# Patient Record
Sex: Male | Born: 1960 | Race: White | Hispanic: No | Marital: Married | Smoking: Never smoker
Health system: Southern US, Community
[De-identification: ages and names within clinical notes are randomized; demographics above are authoritative.]

## PROBLEM LIST (undated history)

## (undated) DIAGNOSIS — F32A Depression, unspecified: Secondary | ICD-10-CM

## (undated) DIAGNOSIS — J329 Chronic sinusitis, unspecified: Secondary | ICD-10-CM

## (undated) DIAGNOSIS — K219 Gastro-esophageal reflux disease without esophagitis: Secondary | ICD-10-CM

## (undated) DIAGNOSIS — T7840XA Allergy, unspecified, initial encounter: Secondary | ICD-10-CM

## (undated) DIAGNOSIS — F419 Anxiety disorder, unspecified: Secondary | ICD-10-CM

## (undated) DIAGNOSIS — F329 Major depressive disorder, single episode, unspecified: Secondary | ICD-10-CM

## (undated) HISTORY — PX: COLONOSCOPY: SHX174

## (undated) HISTORY — DX: Major depressive disorder, single episode, unspecified: F32.9

## (undated) HISTORY — DX: Allergy, unspecified, initial encounter: T78.40XA

## (undated) HISTORY — DX: Chronic sinusitis, unspecified: J32.9

## (undated) HISTORY — DX: Anxiety disorder, unspecified: F41.9

## (undated) HISTORY — DX: Gastro-esophageal reflux disease without esophagitis: K21.9

## (undated) HISTORY — DX: Depression, unspecified: F32.A

---

## 1998-12-04 ENCOUNTER — Ambulatory Visit (HOSPITAL_COMMUNITY): Admission: RE | Admit: 1998-12-04 | Discharge: 1998-12-04 | Payer: Self-pay | Admitting: Gastroenterology

## 1998-12-04 ENCOUNTER — Encounter: Payer: Self-pay | Admitting: Gastroenterology

## 1999-11-24 HISTORY — PX: OTHER SURGICAL HISTORY: SHX169

## 2005-07-15 ENCOUNTER — Ambulatory Visit: Payer: Self-pay | Admitting: Internal Medicine

## 2005-09-24 ENCOUNTER — Ambulatory Visit: Payer: Self-pay | Admitting: Internal Medicine

## 2005-10-29 ENCOUNTER — Ambulatory Visit: Payer: Self-pay | Admitting: Internal Medicine

## 2006-01-20 ENCOUNTER — Ambulatory Visit: Payer: Self-pay | Admitting: Internal Medicine

## 2006-02-04 ENCOUNTER — Ambulatory Visit: Payer: Self-pay | Admitting: Internal Medicine

## 2006-04-27 ENCOUNTER — Ambulatory Visit: Payer: Self-pay | Admitting: Internal Medicine

## 2007-03-25 ENCOUNTER — Ambulatory Visit: Payer: Self-pay | Admitting: Internal Medicine

## 2007-09-26 ENCOUNTER — Telehealth: Payer: Self-pay | Admitting: Internal Medicine

## 2007-09-29 ENCOUNTER — Telehealth: Payer: Self-pay | Admitting: Internal Medicine

## 2007-10-03 ENCOUNTER — Telehealth: Payer: Self-pay | Admitting: *Deleted

## 2007-10-07 ENCOUNTER — Ambulatory Visit: Payer: Self-pay | Admitting: Internal Medicine

## 2007-10-07 DIAGNOSIS — F32A Depression, unspecified: Secondary | ICD-10-CM | POA: Insufficient documentation

## 2007-10-07 DIAGNOSIS — F411 Generalized anxiety disorder: Secondary | ICD-10-CM | POA: Insufficient documentation

## 2007-10-07 DIAGNOSIS — J328 Other chronic sinusitis: Secondary | ICD-10-CM

## 2007-10-07 DIAGNOSIS — K219 Gastro-esophageal reflux disease without esophagitis: Secondary | ICD-10-CM | POA: Insufficient documentation

## 2007-10-07 DIAGNOSIS — F329 Major depressive disorder, single episode, unspecified: Secondary | ICD-10-CM

## 2007-10-18 ENCOUNTER — Encounter: Payer: Self-pay | Admitting: Internal Medicine

## 2007-10-19 ENCOUNTER — Encounter: Admission: RE | Admit: 2007-10-19 | Discharge: 2007-10-19 | Payer: Self-pay | Admitting: Ophthalmology

## 2008-01-12 ENCOUNTER — Ambulatory Visit: Payer: Self-pay | Admitting: Internal Medicine

## 2008-02-11 ENCOUNTER — Telehealth: Payer: Self-pay | Admitting: Internal Medicine

## 2008-02-11 ENCOUNTER — Ambulatory Visit: Payer: Self-pay | Admitting: Internal Medicine

## 2008-02-11 DIAGNOSIS — J039 Acute tonsillitis, unspecified: Secondary | ICD-10-CM

## 2008-02-11 LAB — CONVERTED CEMR LAB: Rapid Strep: NEGATIVE

## 2008-04-06 ENCOUNTER — Ambulatory Visit: Payer: Self-pay | Admitting: Internal Medicine

## 2008-04-12 ENCOUNTER — Ambulatory Visit: Payer: Self-pay | Admitting: Internal Medicine

## 2008-05-17 ENCOUNTER — Ambulatory Visit: Payer: Self-pay | Admitting: Internal Medicine

## 2008-08-17 ENCOUNTER — Ambulatory Visit: Payer: Self-pay | Admitting: Internal Medicine

## 2009-01-31 ENCOUNTER — Ambulatory Visit: Payer: Self-pay | Admitting: Internal Medicine

## 2009-04-23 ENCOUNTER — Telehealth: Payer: Self-pay | Admitting: Internal Medicine

## 2009-04-23 ENCOUNTER — Ambulatory Visit: Payer: Self-pay | Admitting: Internal Medicine

## 2009-04-23 DIAGNOSIS — J019 Acute sinusitis, unspecified: Secondary | ICD-10-CM

## 2009-05-20 ENCOUNTER — Encounter: Payer: Self-pay | Admitting: Internal Medicine

## 2009-08-19 ENCOUNTER — Telehealth (INDEPENDENT_AMBULATORY_CARE_PROVIDER_SITE_OTHER): Payer: Self-pay | Admitting: *Deleted

## 2009-08-19 ENCOUNTER — Telehealth: Payer: Self-pay | Admitting: *Deleted

## 2009-08-28 ENCOUNTER — Ambulatory Visit: Payer: Self-pay | Admitting: Internal Medicine

## 2009-08-28 DIAGNOSIS — L255 Unspecified contact dermatitis due to plants, except food: Secondary | ICD-10-CM

## 2009-12-20 ENCOUNTER — Ambulatory Visit: Payer: Self-pay | Admitting: Internal Medicine

## 2010-01-06 ENCOUNTER — Telehealth: Payer: Self-pay | Admitting: Internal Medicine

## 2010-01-07 ENCOUNTER — Telehealth: Payer: Self-pay | Admitting: Internal Medicine

## 2010-05-01 ENCOUNTER — Telehealth: Payer: Self-pay | Admitting: Internal Medicine

## 2010-05-14 ENCOUNTER — Telehealth: Payer: Self-pay | Admitting: Internal Medicine

## 2010-05-16 ENCOUNTER — Ambulatory Visit: Payer: Self-pay | Admitting: Family Medicine

## 2010-06-02 ENCOUNTER — Ambulatory Visit: Payer: Self-pay | Admitting: Internal Medicine

## 2010-06-02 DIAGNOSIS — E291 Testicular hypofunction: Secondary | ICD-10-CM | POA: Insufficient documentation

## 2010-06-04 LAB — CONVERTED CEMR LAB: Testosterone: 564.21 ng/dL (ref 350.00–890.00)

## 2010-06-23 ENCOUNTER — Ambulatory Visit: Payer: Self-pay | Admitting: Internal Medicine

## 2010-06-24 ENCOUNTER — Ambulatory Visit: Payer: Self-pay | Admitting: Internal Medicine

## 2010-06-24 ENCOUNTER — Telehealth: Payer: Self-pay | Admitting: Internal Medicine

## 2010-06-24 LAB — CONVERTED CEMR LAB
ALT: 30 units/L (ref 0–53)
AST: 29 units/L (ref 0–37)
Albumin: 4 g/dL (ref 3.5–5.2)
Alkaline Phosphatase: 57 units/L (ref 39–117)
BUN: 12 mg/dL (ref 6–23)
Basophils Absolute: 0.1 10*3/uL (ref 0.0–0.1)
Basophils Relative: 0.8 % (ref 0.0–3.0)
Bilirubin Urine: NEGATIVE
Bilirubin, Direct: 0.1 mg/dL (ref 0.0–0.3)
Blood in Urine, dipstick: NEGATIVE
CO2: 32 meq/L (ref 19–32)
Calcium: 9.4 mg/dL (ref 8.4–10.5)
Chlamydia, Swab/Urine, PCR: NEGATIVE
Chloride: 110 meq/L (ref 96–112)
Cholesterol: 174 mg/dL (ref 0–200)
Creatinine, Ser: 0.9 mg/dL (ref 0.4–1.5)
Eosinophils Absolute: 0.3 10*3/uL (ref 0.0–0.7)
Eosinophils Relative: 4.3 % (ref 0.0–5.0)
GC Probe Amp, Urine: NEGATIVE
GFR calc non Af Amer: 90.63 mL/min (ref >60)
Glucose, Bld: 97 mg/dL (ref 70–99)
Glucose, Urine, Semiquant: NEGATIVE
HCT: 44.7 % (ref 39.0–52.0)
HDL: 32.4 mg/dL — ABNORMAL LOW (ref >39.00)
Hemoglobin: 15.2 g/dL (ref 13.0–17.0)
Ketones, urine, test strip: NEGATIVE
LDL Cholesterol: 123 mg/dL — ABNORMAL HIGH (ref 0–99)
Lymphocytes Relative: 33.6 % (ref 12.0–46.0)
Lymphs Abs: 2.1 10*3/uL (ref 0.7–4.0)
MCHC: 33.9 g/dL (ref 30.0–36.0)
MCV: 94.7 fL (ref 78.0–100.0)
Monocytes Absolute: 0.6 10*3/uL (ref 0.1–1.0)
Monocytes Relative: 9.3 % (ref 3.0–12.0)
Neutro Abs: 3.2 10*3/uL (ref 1.4–7.7)
Neutrophils Relative %: 52 % (ref 43.0–77.0)
Nitrite: NEGATIVE
Platelets: 311 10*3/uL (ref 150.0–400.0)
Potassium: 4.1 meq/L (ref 3.5–5.1)
Protein, U semiquant: NEGATIVE
RBC: 4.73 M/uL (ref 4.22–5.81)
RDW: 13.1 % (ref 11.5–14.6)
Sodium: 147 meq/L — ABNORMAL HIGH (ref 135–145)
Specific Gravity, Urine: 1.02
TSH: 1.06 microintl units/mL (ref 0.35–5.50)
Total Bilirubin: 0.6 mg/dL (ref 0.3–1.2)
Total CHOL/HDL Ratio: 5
Total Protein: 6.9 g/dL (ref 6.0–8.3)
Triglycerides: 93 mg/dL (ref 0.0–149.0)
Urobilinogen, UA: 0.2
VLDL: 18.6 mg/dL (ref 0.0–40.0)
WBC Urine, dipstick: NEGATIVE
WBC: 6.2 10*3/uL (ref 4.5–10.5)
pH: 6.5

## 2010-06-30 ENCOUNTER — Ambulatory Visit: Payer: Self-pay | Admitting: Internal Medicine

## 2010-10-22 ENCOUNTER — Ambulatory Visit: Payer: Self-pay | Admitting: Internal Medicine

## 2010-10-22 DIAGNOSIS — J309 Allergic rhinitis, unspecified: Secondary | ICD-10-CM | POA: Insufficient documentation

## 2010-10-22 DIAGNOSIS — J310 Chronic rhinitis: Secondary | ICD-10-CM

## 2010-12-23 NOTE — Progress Notes (Signed)
Summary: sinus infection  Phone Note Call from Patient   Caller: Patient Call For: Stacie Glaze MD Summary of Call: Pt would like RX for sinus infection.....green mucus from nose, stuffiness, headache, and not sleeping due to congestion. No fever. [facility]) [phone] Initial call taken by: Lynann Beaver CMA,  May 01, 2010 11:18 AM  Follow-up for Phone Call        biaxin 500 two times a day for 1o days and use of allegrad two times a day witch is not OTC Follow-up by: Stacie Glaze MD,  May 01, 2010 1:15 PM    New/Updated Medications: BIAXIN 500 MG TABS (CLARITHROMYCIN) one by mouth two times a day x 10 days ALLEGRA-D 12 HOUR 60-120 MG XR12H-TAB (FEXOFENADINE-PSEUDOEPHEDRINE) one by mouth bid Prescriptions: ALLEGRA-D 12 HOUR 60-120 MG XR12H-TAB (FEXOFENADINE-PSEUDOEPHEDRINE) one by mouth bid  #20 x 0   Entered by:   Lynann Beaver CMA   Authorized by:   Stacie Glaze MD   Signed by:   Lynann Beaver CMA on 05/01/2010   Method used:   Electronically to        [facility] ([facility])       [address]       [place], [place]  [address]       Ph: [phone]       Fax: [phone]   RxID:   [id number] BIAXIN 500 MG TABS (CLARITHROMYCIN) one by mouth two times a day x 10 days  #20 x 0   Entered by:   Lynann Beaver CMA   Authorized by:   Stacie Glaze MD   Signed by:   Lynann Beaver CMA on 05/01/2010   Method used:   Electronically to        [facility] ([facility])       [address]       [place], [place]  [address]       Ph: [phone]       Fax: [phone]   RxID:   [id number]  Pt notified.

## 2010-12-23 NOTE — Progress Notes (Signed)
Summary: Pt req to add Abilify to Celexa. Also still has sinus inf  Phone Note Call from Patient Call back at [facility] Phone [phone]   Caller: Patient Summary of Call: Pt called and says that he has been on generic for Celexa and wants to discuss possibly changing med to Abilify for adding Abilify to the Celexa. Pt also says that he has finished the antibiotic, but still has discolored sinus discharge.     Initial call taken by: Lucy Antigua,  May 14, 2010 10:40 AM  Follow-up for Phone Call        changing medicastion will requeire an ov, as far as sinus problem can come in and see dr blyth Follow-up by: Willy Eddy, LPN,  May 14, 2010 10:44 AM  Additional Follow-up for Phone Call Additional follow up Details #1::        I called and lft vm for pt to call back and sch appt with Dr. Abner Greenspan re: sinus inf and also notified he that he would need ov with PCP for med changes, as noted above. Waiting on call back.   Additional Follow-up by: Lucy Antigua,  May 14, 2010 1:09 PM

## 2010-12-23 NOTE — Assessment & Plan Note (Signed)
Summary: MEDICATION CONCERNS / SINUSITIS // RS   Vital Signs:  Patient profile:   50 year old male Height:      73 inches Weight:      212 pounds BMI:     28.07 Temp:     98.2 degrees F oral Pulse rate:   72 / minute Resp:     14 per minute BP sitting:   130 / 80  (left arm)  Vitals Entered By: Willy Eddy, LPN (June 02, 2010 10:44 AM) CC: roa- c/o ch ronic sinusitis after 2 rounds of antibioitcs with no relief-m c/o fatigue and celexa doesnt appear to be working as well as at first, URI symptoms   Primary Care Provider:  Stacie Glaze MD  CC:  roa- c/o ch ronic sinusitis after 2 rounds of antibioitcs with no relief-m c/o fatigue and celexa doesnt appear to be working as well as at first and URI symptoms.  History of Present Illness:  URI Symptoms      This is a 50 year old man who presents with URI symptoms.  The patient reports nasal congestion, productive cough, and earache, but denies clear nasal discharge, purulent nasal discharge, sore throat, dry cough, and sick contacts.  The patient denies fever, low-grade fever (<100.5 degrees), fever of 100.5-103 degrees, fever of 103.1-104 degrees, fever to >104 degrees, stiff neck, dyspnea, wheezing, rash, vomiting, diarrhea, use of an antipyretic, and response to antipyretic.  The patient also reports headache.    Preventive Screening-Counseling & Management  Alcohol-Tobacco     Smoking Status: never  Problems Prior to Update: 1)  Plant Dermatitis  (ICD-692.6) 2)  Acute Sinusitis, Unspecified  (ICD-461.9) 3)  Contraceptive Management  (ICD-V25.09) 4)  Tonsillitis, Exudative, Acute  (ICD-463) 5)  Other Chronic Sinusitis  (ICD-473.8) 6)  Depression  (ICD-311) 7)  Anxiety  (ICD-300.00) 8)  Gerd  (ICD-530.81)  Current Problems (verified): 1)  Plant Dermatitis  (ICD-692.6) 2)  Acute Sinusitis, Unspecified  (ICD-461.9) 3)  Contraceptive Management  (ICD-V25.09) 4)  Tonsillitis, Exudative, Acute  (ICD-463) 5)  Other  Chronic Sinusitis  (ICD-473.8) 6)  Depression  (ICD-311) 7)  Anxiety  (ICD-300.00) 8)  Gerd  (ICD-530.81)  Medications Prior to Update: 1)  Omeprazole 20 Mg  Tbec (Omeprazole) .... Once Daily 2)  Afrin Allergy 0.5 %  Soln (Phenylephrine Hcl) .... As Directed 3)  Celexa 40 Mg  Tabs (Citalopram Hydrobromide) .... One By Mouth Daily 4)  Mucinex D 60-600 Mg Xr12h-Tab (Pseudoephedrine-Guaifenesin) .... One By Mouth Two Times A Day 5)  Allegra-D 12 Hour 60-120 Mg Xr12h-Tab (Fexofenadine-Pseudoephedrine) .... One By Mouth Bid 6)  Cefdinir 300 Mg Caps (Cefdinir) .... One By Mouth Two Times A Day For 10 Days  Current Medications (verified): 1)  Omeprazole 20 Mg  Tbec (Omeprazole) .... Once Daily 2)  Afrin Allergy 0.5 %  Soln (Phenylephrine Hcl) .... As Directed 3)  Celexa 40 Mg  Tabs (Citalopram Hydrobromide) .... One By Mouth Daily 4)  Mucinex D 60-600 Mg Xr12h-Tab (Pseudoephedrine-Guaifenesin) .... One By Mouth Two Times A Day 5)  Allegra-D 12 Hour 60-120 Mg Xr12h-Tab (Fexofenadine-Pseudoephedrine) .... One By Mouth Bid 6)  Cefdinir 300 Mg Caps (Cefdinir) .... One By Mouth Two Times A Day For 10 Days  Allergies (verified): 1)  ! Pcn  Past History:  Social History: Last updated: 01/12/2008 Retired Single Divorced  Risk Factors: Smoking Status: never (06/02/2010)  Past medical, surgical, family and social histories (including risk factors) reviewed, and no changes noted (except  as noted below).  Past Medical History: Reviewed history from 10/07/2007 and no changes required. GERD Anxiety Depression  Past Surgical History: Reviewed history from 10/07/2007 and no changes required. EGD with dilation  Family History: Reviewed history and no changes required.  Social History: Reviewed history from 01/12/2008 and no changes required. Retired Single Divorced  Review of Systems  The patient denies anorexia, fever, weight loss, weight gain, vision loss, decreased hearing,  hoarseness, chest pain, syncope, dyspnea on exertion, peripheral edema, prolonged cough, headaches, hemoptysis, abdominal pain, melena, hematochezia, severe indigestion/heartburn, hematuria, incontinence, genital sores, muscle weakness, suspicious skin lesions, transient blindness, difficulty walking, depression, unusual weight change, abnormal bleeding, enlarged lymph nodes, angioedema, and breast masses.    Physical Exam  General:  Well-developed,well-nourished,in no acute distress; alert,appropriate and cooperative throughout examination Head:  normocephalic and atraumatic.   Eyes:  vision grossly intact, pupils equal, and pupils round. no redness  Ears:  External ear exam shows no significant lesions or deformities.  Otoscopic examination reveals clear canals, tympanic membranes are intact bilaterally without bulging, retraction, inflammation or discharge. Hearing is grossly normal bilaterally. Mouth:  Oral mucosa and oropharynx without lesions or exudates.  Teeth in good repair. Neck:  No deformities, masses, or tenderness noted. Lungs:  Normal respiratory effort, chest expands symmetrically. Lungs are clear to auscultation, no crackles or wheezes. Heart:  normal rate and regular rhythm.   Abdomen:  soft and non-tender.     Impression & Recommendations:  Problem # 1:  OTHER CHRONIC SINUSITIS (ICD-473.8) the pt has some persistantce of the sinus pain and has persistant pressure The following medications were removed from the medication list:    Cefdinir 300 Mg Caps (Cefdinir) ..... One by mouth two times a day for 10 days His updated medication list for this problem includes:    Afrin Allergy 0.5 % Soln (Phenylephrine hcl) .Marland Kitchen... As directed    Mucinex D 60-600 Mg Xr12h-tab (Pseudoephedrine-guaifenesin) ..... One by mouth two times a day    Allegra-d 12 Hour 60-120 Mg Xr12h-tab (Fexofenadine-pseudoephedrine) ..... One by mouth bid    Doxycycline Hyclate 100 Mg Caps (Doxycycline hyclate)  ..... One by mouth two times a day for 21 days  Take antibiotics for full duration. Discussed treatment options including indications for coronal CT scan of sinuses and ENT referral.   Problem # 2:  DEPRESSION (ICD-311) had a tough month in june with isolation and low energy has been doing better over the last few week still notes decline in energy His updated medication list for this problem includes:    Citalopram Hydrobromide 40 Mg Tabs (Citalopram hydrobromide) ..... One by mouth daily  Discussed treatment options, including trial of antidpressant medication. Will refer to behavioral health. Follow-up call in in 24-48 hours and recheck in 2 weeks, sooner as needed. Patient agrees to call if any worsening of symptoms or thoughts of doing harm arise. Verified that the patient has no suicidal ideation at this time.   Problem # 3:  HYPOGONADISM (ICD-257.2)  Orders: Venipuncture (14782) TLB-Testosterone, Total (84403-TESTO)  Complete Medication List: 1)  Omeprazole 20 Mg Tbec (Omeprazole) .... Once daily 2)  Afrin Allergy 0.5 % Soln (Phenylephrine hcl) .... As directed 3)  Citalopram Hydrobromide 40 Mg Tabs (Citalopram hydrobromide) .... One by mouth daily 4)  Mucinex D 60-600 Mg Xr12h-tab (Pseudoephedrine-guaifenesin) .... One by mouth two times a day 5)  Allegra-d 12 Hour 60-120 Mg Xr12h-tab (Fexofenadine-pseudoephedrine) .... One by mouth bid 6)  Doxycycline Hyclate 100 Mg Caps (Doxycycline hyclate) .[facility]  One by mouth two times a day for 21 days  Patient Instructions: 1)  omnaris two times a day 2)   for 2 weeks and then once a day 3)  try to stop the afrin Prescriptions: CITALOPRAM HYDROBROMIDE 40 MG TABS (CITALOPRAM HYDROBROMIDE) one by mouth daily  #90 x 3   Entered and Authorized by:   Stacie Glaze MD   Signed by:   Stacie Glaze MD on 06/02/2010   Method used:   Electronically to        [facility] # [id number][facility] (retail)       [address]        [place], [place]  [address]       Ph: [phone]       Fax: [phone]   RxID:   [id number] DOXYCYCLINE HYCLATE 100 MG CAPS (DOXYCYCLINE HYCLATE) one by mouth two times a day for 21 days  #42 x 0   Entered and Authorized by:   Stacie Glaze MD   Signed by:   Stacie Glaze MD on 06/02/2010   Method used:   Electronically to        [facility] # [id number]* (retail)       [address]       [place], [place]  [address]       Ph: [phone]       Fax: [phone]   RxID:   [id number]

## 2010-12-23 NOTE — Progress Notes (Signed)
  Phone Note Call from Patient   Reason for Call: Lab or Test Results Summary of Call: pt called stating levaquin cost too muxch-wants something cheapier-[facility] Initial call taken by: Willy Eddy, LPN,  January 07, 2010 2:28 PM  Follow-up for Phone Call        per dr Lovell Sheehan- may have clarithromycin xl 500 1 two times a day for 10 days Follow-up by: Willy Eddy, LPN,  January 07, 2010 2:39 PM    New/Updated Medications: CLARITHROMYCIN 500 MG TABS (CLARITHROMYCIN) 1 two times a day Prescriptions: CLARITHROMYCIN 500 MG TABS (CLARITHROMYCIN) 1 two times a day  #20 x 0   Entered by:   Raechel Ache, RN   Authorized by:   Stacie Glaze MD   Signed by:   Raechel Ache, RN on 01/07/2010   Method used:   Electronically to        [facility] # [id number]* (retail)       [address]       [place], [place]  [address]       Ph: [phone]       Fax: [phone]   RxID:   [id number]

## 2010-12-23 NOTE — Progress Notes (Signed)
Summary: refills  Phone Note Refill Request Call back at Home Phone [phone] Message from:  Patient---live call  Refills Requested: Medication #1:  CITALOPRAM HYDROBROMIDE 40 MG TABS one by mouth daily [facility]. pt is out of meds.  Initial call taken by: Warnell Forester,  June 24, 2010 8:25 AM    Prescriptions: CITALOPRAM HYDROBROMIDE 40 MG TABS (CITALOPRAM HYDROBROMIDE) one by mouth daily  #90 x 0   Entered by:   Lynann Beaver CMA   Authorized by:   Stacie Glaze MD   Signed by:   Lynann Beaver CMA on 06/24/2010   Method used:   Electronically to        [facility] # [id number]* (retail)       [address]       [place], [place]  [address]       Ph: [phone]       Fax: [phone]   RxID:   [id number]

## 2010-12-23 NOTE — Assessment & Plan Note (Signed)
Summary: SINUSITIS // RS   Vital Signs:  Patient profile:   50 year old male Height:      73 inches Weight:      216 pounds BMI:     28.60 Temp:     98.6 degrees F oral Pulse rate:   76 / minute Resp:     14 per minute BP sitting:   130 / 84  (left arm)  Vitals Entered By: Willy Eddy, LPN (December 20, 2009 4:21 PM) CC: c/o sinsusitis like sx, URI symptoms   CC:  c/o sinsusitis like sx and URI symptoms.  History of Present Illness: sinus symptoms sick for 1 week  URI Symptoms      This is a 50 year old man who presents with URI symptoms.  The patient reports nasal congestion, purulent nasal discharge, and earache.  The patient denies fever, low-grade fever (<100.5 degrees), fever of 100.5-103 degrees, fever of 103.1-104 degrees, fever to >104 degrees, stiff neck, dyspnea, wheezing, rash, vomiting, diarrhea, use of an antipyretic, and response to antipyretic.  The patient also reports headache.    Preventive Screening-Counseling & Management  Alcohol-Tobacco     Smoking Status: never  Problems Prior to Update: 1)  Plant Dermatitis  (ICD-692.6) 2)  Acute Sinusitis, Unspecified  (ICD-461.9) 3)  Contraceptive Management  (ICD-V25.09) 4)  Tonsillitis, Exudative, Acute  (ICD-463) 5)  Other Chronic Sinusitis  (ICD-473.8) 6)  Depression  (ICD-311) 7)  Anxiety  (ICD-300.00) 8)  Gerd  (ICD-530.81)  Current Problems (verified): 1)  Plant Dermatitis  (ICD-692.6) 2)  Acute Sinusitis, Unspecified  (ICD-461.9) 3)  Contraceptive Management  (ICD-V25.09) 4)  Tonsillitis, Exudative, Acute  (ICD-463) 5)  Other Chronic Sinusitis  (ICD-473.8) 6)  Depression  (ICD-311) 7)  Anxiety  (ICD-300.00) 8)  Gerd  (ICD-530.81)  Medications Prior to Update: 1)  Omeprazole 20 Mg  Tbec (Omeprazole) .... Once Daily 2)  Afrin Allergy 0.5 %  Soln (Phenylephrine Hcl) .... As Directed 3)  Celexa 40 Mg  Tabs (Citalopram Hydrobromide) .... One By Mouth Daily 4)  Prednisone 20 Mg Tabs (Prednisone)  .... Two By Mouth For 4 Days The One By Mouth For 4 Days Then 1/2 By Mouth For 4 Days  Current Medications (verified): 1)  Omeprazole 20 Mg  Tbec (Omeprazole) .... Once Daily 2)  Afrin Allergy 0.5 %  Soln (Phenylephrine Hcl) .... As Directed 3)  Celexa 40 Mg  Tabs (Citalopram Hydrobromide) .... One By Mouth Daily 4)  Smz-Tmp Ds 800-160 Mg Tabs (Sulfamethoxazole-Trimethoprim) .... One By Mouth Two Times A Day 5)  Mucinex D 60-600 Mg Xr12h-Tab (Pseudoephedrine-Guaifenesin) .... One By Mouth Two Times A Day  Allergies (verified): 1)  ! Pcn  Past History:  Social History: Last updated: 01/12/2008 Retired Single Divorced  Risk Factors: Smoking Status: never (12/20/2009)  Past medical, surgical, family and social histories (including risk factors) reviewed, and no changes noted (except as noted below).  Past Medical History: Reviewed history from 10/07/2007 and no changes required. GERD Anxiety Depression  Past Surgical History: Reviewed history from 10/07/2007 and no changes required. EGD with dilation  Family History: Reviewed history and no changes required.  Social History: Reviewed history from 01/12/2008 and no changes required. Retired Single Divorced Smoking Status:  never  Review of Systems       The patient complains of hoarseness and peripheral edema.  The patient denies anorexia, fever, weight loss, weight gain, vision loss, decreased hearing, chest pain, syncope, dyspnea on exertion, prolonged cough, headaches, hemoptysis,  abdominal pain, melena, hematochezia, severe indigestion/heartburn, hematuria, incontinence, genital sores, muscle weakness, suspicious skin lesions, transient blindness, difficulty walking, depression, unusual weight change, abnormal bleeding, enlarged lymph nodes, angioedema, and breast masses.    Physical Exam  General:  alert, well-developed, well-nourished, and well-hydrated.  speech normal looks tired Head:  normocephalic and  atraumatic.   Ears:  R ear normal and L ear normal.   Nose:  mucosal edema and airflow obstruction.   Mouth:  posterior lymphoid hypertrophy and postnasal drip.   Neck:  No deformities, masses, or tenderness noted. Lungs:  normal respiratory effort, no intercostal retractions, and no accessory muscle use.   Heart:  Normal rate and regular rhythm. S1 and S2 normal without gallop, murmur, click, rub or other extra sounds. Abdomen:  soft and non-tender.     Impression & Recommendations:  Problem # 1:  ACUTE SINUSITIS, UNSPECIFIED (ICD-461.9)  His updated medication list for this problem includes:    Afrin Allergy 0.5 % Soln (Phenylephrine hcl) .Marland Kitchen... As directed    Smz-tmp Ds 800-160 Mg Tabs (Sulfamethoxazole-trimethoprim) ..... One by mouth two times a day    Mucinex D 60-600 Mg Xr12h-tab (Pseudoephedrine-guaifenesin) ..... One by mouth two times a day  Instructed on treatment. Call if symptoms persist or worsen.   Complete Medication List: 1)  Omeprazole 20 Mg Tbec (Omeprazole) .... Once daily 2)  Afrin Allergy 0.5 % Soln (Phenylephrine hcl) .... As directed 3)  Celexa 40 Mg Tabs (Citalopram hydrobromide) .... One by mouth daily 4)  Smz-tmp Ds 800-160 Mg Tabs (Sulfamethoxazole-trimethoprim) .... One by mouth two times a day 5)  Mucinex D 60-600 Mg Xr12h-tab (Pseudoephedrine-guaifenesin) .... One by mouth two times a day  Patient Instructions: 1)  Take your antibiotic as prescribed until ALL of it is gone, but stop if you develop a rash or swelling and contact our office as soon as possible. Prescriptions: MUCINEX D 60-600 MG XR12H-TAB (PSEUDOEPHEDRINE-GUAIFENESIN) one by mouth two times a day  #20 x 1   Entered and Authorized by:   Stacie Glaze MD   Signed by:   Stacie Glaze MD on 12/20/2009   Method used:   Electronically to        [facility] # [phone]* (retail)       [address]       [place], [place]  [address]       Ph: [phone]       Fax:  [phone]   RxID:   [id number] SMZ-TMP DS 800-160 MG TABS (SULFAMETHOXAZOLE-TRIMETHOPRIM) one by mouth two times a day  #20 x 0   Entered and Authorized by:   Stacie Glaze MD   Signed by:   Stacie Glaze MD on 12/20/2009   Method used:   Electronically to        [facility] [id number] (retail)       [address]       [place], [place]  [address]       Ph: [phone]       Fax: [phone]   RxID:   [id number]

## 2010-12-23 NOTE — Progress Notes (Signed)
Summary: sinus not better  Phone Note Call from Patient Call back at Home Phone [phone]   Caller: VM Summary of Call: Finished round of antibiotics for sinus infection.  It's been a couple weeks.  Have not gotten any better.  Another round or something different.  [facility]   Initial call taken by: Rudy Jew, RN,  January 06, 2010 8:52 AM  Follow-up for Phone Call        per dr j Nicki [place] levaquin 500 once daily for 14 days and get limited ct of sinuses. Follow-up by: Willy Eddy, LPN,  January 06, 2010 9:08 AM  Additional Follow-up for Phone Call Additional follow up Details #1::        Pt notified. and CT Sinus will be scheduled. Additional Follow-up by: Lynann Beaver CMA,  January 06, 2010 9:31 AM

## 2010-12-23 NOTE — Assessment & Plan Note (Signed)
Summary: SINUSITIS // RS   Vital Signs:  Patient profile:   50 year old male Temp:     98.0 degrees F oral BP sitting:   122 / 84  (left arm) Cuff size:   regular  Vitals Entered By: Sid Falcon LPN (May 16, 2010 2:47 PM) CC: Ongoing sinus pressure   History of Present Illness: Hx of freq sinusitis.  3 weeks hx of sinus congestion and malaise. Taking mucinex and Biaxin without improvement . Frontal sinus pressure.  Recent colored mucus.  headaches off and on. No cough.  No definite fever.  Allergies: 1)  ! Pcn  Past History:  Past Medical History: Last updated: 10/07/2007 GERD Anxiety Depression  Review of Systems      See HPI  Physical Exam  General:  Well-developed,well-nourished,in no acute distress; alert,appropriate and cooperative throughout examination Ears:  External ear exam shows no significant lesions or deformities.  Otoscopic examination reveals clear canals, tympanic membranes are intact bilaterally without bulging, retraction, inflammation or discharge. Hearing is grossly normal bilaterally. Nose:  External nasal examination shows no deformity or inflammation. Nasal mucosa are pink and moist without lesions or exudates. Mouth:  Oral mucosa and oropharynx without lesions or exudates.  Teeth in good repair. Neck:  No deformities, masses, or tenderness noted. Lungs:  Normal respiratory effort, chest expands symmetrically. Lungs are clear to auscultation, no crackles or wheezes. Heart:  normal rate and regular rhythm.     Impression & Recommendations:  Problem # 1:  ACUTE SINUSITIS, UNSPECIFIED (ICD-461.9) Assessment New pt allergic to penicilin but has taken keflex without difficulty in past. The following medications were removed from the medication list:    Biaxin 500 Mg Tabs (Clarithromycin) ..... One by mouth two times a day x 10 days His updated medication list for this problem includes:    Afrin Allergy 0.5 % Soln (Phenylephrine hcl) .Marland Kitchen... As  directed    Mucinex D 60-600 Mg Xr12h-tab (Pseudoephedrine-guaifenesin) ..... One by mouth two times a day    Allegra-d 12 Hour 60-120 Mg Xr12h-tab (Fexofenadine-pseudoephedrine) ..... One by mouth bid    Cefdinir 300 Mg Caps (Cefdinir) ..... One by mouth two times a day for 10 days  Complete Medication List: 1)  Omeprazole 20 Mg Tbec (Omeprazole) .... Once daily 2)  Afrin Allergy 0.5 % Soln (Phenylephrine hcl) .... As directed 3)  Celexa 40 Mg Tabs (Citalopram hydrobromide) .... One by mouth daily 4)  Mucinex D 60-600 Mg Xr12h-tab (Pseudoephedrine-guaifenesin) .... One by mouth two times a day 5)  Allegra-d 12 Hour 60-120 Mg Xr12h-tab (Fexofenadine-pseudoephedrine) .... One by mouth bid 6)  Cefdinir 300 Mg Caps (Cefdinir) .... One by mouth two times a day for 10 days  Patient Instructions: 1)  Acute sinusitis symptoms for less than 10 days are not helped by antibiotics. Use warm moist compresses, and over the counter decongestants( only as directed). Call if no improvement in 5-7 days, sooner if increasing pain, fever, or new symptoms.  Prescriptions: CEFDINIR 300 MG CAPS (CEFDINIR) one by mouth two times a day for 10 days  #20 x 0   Entered and Authorized by:   Evelena Peat MD   Signed by:   Evelena Peat MD on 05/16/2010   Method used:   Electronically to        [facility] # [phone]* (retail)       [address]       [place], [place]  [address]       Ph:  [phone]       Fax: [phone]   RxID:   [id number]

## 2010-12-23 NOTE — Assessment & Plan Note (Signed)
Summary: CHEST CONGESTION/NJR   Vital Signs:  Patient profile:   50 year old male Weight:      220 pounds O2 Sat:      96 % Temp:     98.2 degrees F oral Pulse rate:   80 / minute Pulse rhythm:   regular Resp:     16 per minute BP sitting:   134 / 80  Vitals Entered By: Lynann Beaver CMA AAMA (October 22, 2010 9:03 AM) CC: chest congestion with colored productive cough and URI symptoms x one month Is Patient Diabetic? No Pain Assessment Patient in pain? no        History of Present Illness: Terry Mcpherson comes in today  for acute visit SDA for above problem   this was a cold and  then progressed.  to above.   has a ling hc of UR disease.   remote hs of  having Ct scan s  ENT  in past . Uses    and netti pot   and now afrin    dependent currently.   .   To go on a cruise tomorrow.      Show in a week and lost voice.   and needs voice for  his performance in a week.   No cp sob or wheezing and no hx of asthma .    NO ets or tobacco   has  recurrent phelgm green  no blood only in am.  .   Preventive Screening-Counseling & Management  Alcohol-Tobacco     Smoking Status: never     Passive Smoke Counseling: not indicated; no passive smoke exposure  Current Medications (verified): 1)  Omeprazole 20 Mg  Tbec (Omeprazole) .... Once Daily 2)  Afrin Allergy 0.5 %  Soln (Phenylephrine Hcl) .... As Directed 3)  Citalopram Hydrobromide 20 Mg Tabs (Citalopram Hydrobromide) .... One By Mouth Daily 4)  Krill Oil 1000 Mg Caps (Krill Oil) .... One By Mouth Bid 5)  Mucinex Maximum Strength 1200 Mg Xr12h-Tab (Guaifenesin) .... As Directed  Allergies (verified): 1)  ! Pcn  Past History:  Past Medical History: GERD Anxiety Depression Chronic sinusitis poss allergic rhinitis  Family History: allergy and sinus disease in parents  Social History: Retired Single Divorced Never Smoked  Review of Systems       The patient complains of hoarseness and prolonged cough.  The  patient denies anorexia, fever, weight loss, weight gain, vision loss, decreased hearing, chest pain, syncope, dyspnea on exertion, headaches, abdominal pain, difficulty walking, abnormal bleeding, enlarged lymph nodes, and angioedema.    Physical Exam  General:  congested  and hoarse in NAD   well-nourished, well-hydrated, normal appearance, and healthy-appearing.   Head:  normocephalic and atraumatic.   Eyes:  clear  no dishcarge  Ears:  R ear normal and L ear normal.   Nose:  no external deformity and no external erythema.  2+ turbinates   face nontendern  Mouth:  red posterior no lesions   cobblestoning. Neck:  No deformities, masses, or tenderness noted. Lungs:  Normal respiratory effort, chest expands symmetrically. Lungs are clear to auscultation, no crackles or wheezes.no dullness.   Heart:  Normal rate and regular rhythm. S1 and S2 normal without gallop, murmur, click, rub or other extra sounds. Cervical Nodes:  No lymphadenopathy noted Psych:  Oriented X3, good eye contact, not anxious appearing, and not depressed appearing.     Impression & Recommendations:  Problem # 1:  OTHER CHRONIC SINUSITIS (ICD-473.8) acute  on chronic   although this trigger was a uri   no evidence of sig LRTI  continue   sinus irrigations   apparently had had ent eval and sinus ct in the past  .  decongestants give hiim sideeffects The following medications were removed from the medication list:    Allegra-d 12 Hour 60-120 Mg Xr12h-tab (Fexofenadine-pseudoephedrine) ..... One by mouth bid His updated medication list for this problem includes:    Afrin Allergy 0.5 % Soln (Phenylephrine hcl) .Marland Kitchen... As directed    Mucinex Maximum Strength 1200 Mg Xr12h-tab (Guaifenesin) .Marland Kitchen... As directed    Doxycycline Hyclate 100 Mg Caps (Doxycycline hyclate) .Marland Kitchen... 1 by mouth two times a day    Omnaris 50 Mcg/act Susp (Ciclesonide) .Marland Kitchen... 2 spray  each nostril once daily for  control of aleries  Problem # 2:  RHINITIS  MEDICAMENTOSA (ICD-472.0) and hx of same pt aware about need to wean   and  nasla steroid shousld help   Problem # 3:  ALLERGIC RHINITIS (ICD-477.9) probably  hx of  testing  years ago.       suggest  take a relook to consider avoidance  measure . can disc   with Dr Lovell Sheehan.  sample of pmnaris given. His updated medication list for this problem includes:    Afrin Allergy 0.5 % Soln (Phenylephrine hcl) .Marland Kitchen... As directed    Omnaris 50 Mcg/act Susp (Ciclesonide) .Marland Kitchen... 2 spray  each nostril once daily for  control of aleries  Complete Medication List: 1)  Omeprazole 20 Mg Tbec (Omeprazole) .... Once daily 2)  Afrin Allergy 0.5 % Soln (Phenylephrine hcl) .... As directed 3)  Citalopram Hydrobromide 20 Mg Tabs (Citalopram hydrobromide) .... One by mouth daily 4)  Krill Oil 1000 Mg Caps (Krill oil) .... One by mouth bid 5)  Mucinex Maximum Strength 1200 Mg Xr12h-tab (Guaifenesin) .... As directed 6)  Doxycycline Hyclate 100 Mg Caps (Doxycycline hyclate) .Marland Kitchen.. 1 by mouth two times a day 7)  Prednisone 20 Mg Tabs (Prednisone) .... Take 3 per day for 2 days then 2 per day for 3 days or as directed 8)  Omnaris 50 Mcg/act Susp (Ciclesonide) .... 2 spray  each nostril once daily for  control of aleries  Patient Instructions: 1)  restart   omnaris   nasal cortisone for prevention 2)  consider getting retested  skin vs blood tests for environmenal triggers    so you can do avoidance measures. 3)  Wean off of the afrin  with the prednisone and  nasal steroid( omnaris)  Prescriptions: OMNARIS 50 MCG/ACT SUSP (CICLESONIDE) 2 spray  each nostril once daily for  control of aleries  #1 x 3   Entered and Authorized by:   Madelin Headings MD   Signed by:   Madelin Headings MD on 10/22/2010   Method used:   Print then Give to Patient   RxID:   [id number] PREDNISONE 20 MG TABS (PREDNISONE) take 3 per day for 2 days then 2 per day for 3 days or as directed  #20 x 0   Entered and Authorized by:   Madelin Headings  MD   Signed by:   Madelin Headings MD on 10/22/2010   Method used:   Electronically to        [id number] (retail)       [address]       [place], [place]  [address]       Ph:  [phone]       Fax: [phone]   RxID:   [id number] DOXYCYCLINE HYCLATE 100 MG CAPS (DOXYCYCLINE HYCLATE) 1 by mouth two times a day  #20 x 0   Entered and Authorized by:   Madelin Headings MD   Signed by:   Madelin Headings MD on 10/22/2010   Method used:   Electronically to        [facility] [address] (retail)       [address]       [place], [place]  [address]       Ph: [phone]       Fax: [phone]   RxID:   [id number]    Orders Added: 1)  Est. Patient Level IV [[address]]    Prevention & Chronic Care Immunizations   Influenza vaccine: Fluvax MCR  (10/07/2007)    Tetanus booster: 06/30/2010: Tdap    Pneumococcal vaccine: Not documented  Other Screening   Smoking status: never  (10/22/2010)  Lipids   Total Cholesterol: 174  (06/24/2010)   LDL: 123  (06/24/2010)   LDL Direct: Not documented   HDL: 32.40  (06/24/2010)   Triglycerides: 93.0  (06/24/2010)

## 2010-12-23 NOTE — Assessment & Plan Note (Signed)
Summary: cpx/njr   Vital Signs:  Patient profile:   50 year old male Height:      73 inches Weight:      214 pounds BMI:     28.34 Temp:     98.2 degrees F oral Pulse rate:   68 / minute Resp:     14 per minute BP sitting:   130 / 78  (left arm)  Vitals Entered By: Willy Eddy, LPN (June 30, 2010 2:33 PM) CC: cpx Is Patient Diabetic? No Nutritional Status BMI of 19 -24 = normal   Primary Care Donnald Tabar:  Stacie Glaze MD  CC:  cpx.  History of Present Illness: The pt was asked about all immunizations, health maint. services that are appropriate to their age and was given guidance on diet exercize  and weight management   Preventive Screening-Counseling & Management  Alcohol-Tobacco     Smoking Status: never  Current Problems (verified): 1)  Hypogonadism  (ICD-257.2) 2)  Plant Dermatitis  (ICD-692.6) 3)  Acute Sinusitis, Unspecified  (ICD-461.9) 4)  Contraceptive Management  (ICD-V25.09) 5)  Tonsillitis, Exudative, Acute  (ICD-463) 6)  Other Chronic Sinusitis  (ICD-473.8) 7)  Depression  (ICD-311) 8)  Anxiety  (ICD-300.00) 9)  Gerd  (ICD-530.81)  Current Medications (verified): 1)  Omeprazole 20 Mg  Tbec (Omeprazole) .... Once Daily 2)  Afrin Allergy 0.5 %  Soln (Phenylephrine Hcl) .... As Directed 3)  Citalopram Hydrobromide 40 Mg Tabs (Citalopram Hydrobromide) .... One By Mouth Daily 4)  Mucinex D 60-600 Mg Xr12h-Tab (Pseudoephedrine-Guaifenesin) .... One By Mouth Two Times A Day 5)  Allegra-D 12 Hour 60-120 Mg Xr12h-Tab (Fexofenadine-Pseudoephedrine) .... One By Mouth Bid  Allergies (verified): 1)  ! Pcn  Past History:  Social History: Last updated: 01/12/2008 Retired Single Divorced  Risk Factors: Smoking Status: never (06/30/2010)  Past medical, surgical, family and social histories (including risk factors) reviewed, and no changes noted (except as noted below).  Past Medical History: Reviewed history from 10/07/2007 and no changes  required. GERD Anxiety Depression  Past Surgical History: Reviewed history from 10/07/2007 and no changes required. EGD with dilation  Family History: Reviewed history and no changes required.  Social History: Reviewed history from 01/12/2008 and no changes required. Retired Single Divorced  Review of Systems  The patient denies anorexia, fever, weight loss, weight gain, vision loss, decreased hearing, hoarseness, chest pain, syncope, dyspnea on exertion, peripheral edema, prolonged cough, headaches, hemoptysis, abdominal pain, melena, hematochezia, severe indigestion/heartburn, hematuria, incontinence, genital sores, muscle weakness, suspicious skin lesions, transient blindness, difficulty walking, depression, unusual weight change, abnormal bleeding, enlarged lymph nodes, angioedema, breast masses, and testicular masses.    Physical Exam  General:  Well-developed,well-nourished,in no acute distress; alert,appropriate and cooperative throughout examination Head:  normocephalic and atraumatic.   Eyes:  vision grossly intact, pupils equal, and pupils round. no redness  Ears:  External ear exam shows no significant lesions or deformities.  Otoscopic examination reveals clear canals, tympanic membranes are intact bilaterally without bulging, retraction, inflammation or discharge. Hearing is grossly normal bilaterally. Nose:  External nasal examination shows no deformity or inflammation. Nasal mucosa are pink and moist without lesions or exudates. Mouth:  Oral mucosa and oropharynx without lesions or exudates.  Teeth in good repair. Neck:  No deformities, masses, or tenderness noted. Lungs:  Normal respiratory effort, chest expands symmetrically. Lungs are clear to auscultation, no crackles or wheezes. Heart:  normal rate and regular rhythm.   Abdomen:  soft and non-tender.  Neurologic:  alert & oriented X3 and gait normal.     Impression & Recommendations:  Problem # 1:   PREVENTIVE HEALTH CARE (ICD-V70.0) The pt was asked about all immunizations, health maint. services that are appropriate to their age and was given guidance on diet exercize  and weight management  Orders: EKG w/ Interpretation (93000)  Td Booster: Tdap (06/30/2010)   Flu Vax: Fluvax MCR (10/07/2007)    Discussed using sunscreen, use of alcohol, drug use, self testicular exam, routine dental care, routine eye care, routine physical exam, seat belts, multiple vitamins, osteoporosis prevention, adequate calcium intake in diet, and recommendations for immunizations.  Discussed exercise and checking cholesterol.  Discussed gun safety, safe sex, and contraception. Also recommend checking PSA.  Complete Medication List: 1)  Omeprazole 20 Mg Tbec (Omeprazole) .... Once daily 2)  Afrin Allergy 0.5 % Soln (Phenylephrine hcl) .... As directed 3)  Citalopram Hydrobromide 40 Mg Tabs (Citalopram hydrobromide) .... One by mouth daily 4)  Allegra-d 12 Hour 60-120 Mg Xr12h-tab (Fexofenadine-pseudoephedrine) .... One by mouth bid 5)  Krill Oil 1000 Mg Caps (Krill oil) .... One by mouth bid  Other Orders: Tdap => 18yrs IM (53664) Admin 1st Vaccine (40347)  Patient Instructions: 1)  Please schedule a follow-up appointment in 6 months. 2)  Hepatic Panel prior to visit, ICD-9:995.20 3)  Lipid Panel prior to visit, ICD-9:272.4   Immunizations Administered:  Tetanus Vaccine:    Vaccine Type: Tdap    Site: right deltoid    Mfr: Merck    Dose: 0.5 ml    Route: IM    Given by: Willy Eddy, LPN    Exp. Date: 05/22/2012    Lot #: [id number]

## 2011-04-21 ENCOUNTER — Telehealth: Payer: Self-pay | Admitting: *Deleted

## 2011-04-21 DIAGNOSIS — J322 Chronic ethmoidal sinusitis: Secondary | ICD-10-CM

## 2011-04-21 NOTE — Telephone Encounter (Signed)
May refer to ENT 

## 2011-04-21 NOTE — Telephone Encounter (Signed)
Pt requesting referral to ENT due to chronic sinusitis.

## 2011-04-29 ENCOUNTER — Other Ambulatory Visit: Payer: Self-pay | Admitting: Otolaryngology

## 2011-05-14 ENCOUNTER — Ambulatory Visit
Admission: RE | Admit: 2011-05-14 | Discharge: 2011-05-14 | Disposition: A | Discharge status: Home / Self Care | Payer: 59 | Source: Ambulatory Visit | Attending: Otolaryngology | Admitting: Otolaryngology

## 2011-06-09 ENCOUNTER — Other Ambulatory Visit: Payer: Self-pay | Admitting: Internal Medicine

## 2011-06-09 MED ORDER — CITALOPRAM HYDROBROMIDE 20 MG PO TABS: 20.0000 mg | ORAL_TABLET | Freq: Every day | ORAL | Status: DC

## 2011-06-09 NOTE — Telephone Encounter (Signed)
Pt called req renewal of Citalopram 20 mg 90 day supply. Pls call in to [facility] on [address]

## 2011-06-09 NOTE — Telephone Encounter (Signed)
done

## 2011-10-22 ENCOUNTER — Telehealth: Payer: Self-pay | Admitting: Internal Medicine

## 2011-10-22 MED ORDER — CITALOPRAM HYDROBROMIDE 20 MG PO TABS: 20.0000 mg | ORAL_TABLET | Freq: Every day | ORAL | Status: DC

## 2011-10-22 NOTE — Telephone Encounter (Signed)
°  Pt requesting a 90 day refill on citalopram (CELEXA) 20 MG tablet   [id number] - [place], [place] - [address]

## 2011-10-22 NOTE — Telephone Encounter (Signed)
completed

## 2012-02-03 ENCOUNTER — Other Ambulatory Visit: Payer: Self-pay | Admitting: *Deleted

## 2012-02-03 MED ORDER — CITALOPRAM HYDROBROMIDE 20 MG PO TABS: 20.0000 mg | ORAL_TABLET | Freq: Every day | ORAL | Status: DC

## 2012-06-04 ENCOUNTER — Other Ambulatory Visit: Payer: Self-pay | Admitting: Internal Medicine

## 2012-09-08 ENCOUNTER — Other Ambulatory Visit: Payer: Self-pay | Admitting: Internal Medicine

## 2012-10-06 ENCOUNTER — Ambulatory Visit: Payer: 59 | Admitting: Internal Medicine

## 2012-10-18 ENCOUNTER — Ambulatory Visit (INDEPENDENT_AMBULATORY_CARE_PROVIDER_SITE_OTHER): Payer: 59 | Admitting: Internal Medicine

## 2012-10-18 ENCOUNTER — Encounter: Payer: Self-pay | Admitting: Internal Medicine

## 2012-10-18 VITALS — BP 130/84 | HR 76 | Temp 98.2°F | Resp 16 | Ht 73.0 in | Wt 218.0 lb

## 2012-10-18 DIAGNOSIS — Z9109 Other allergy status, other than to drugs and biological substances: Secondary | ICD-10-CM

## 2012-10-18 DIAGNOSIS — F329 Major depressive disorder, single episode, unspecified: Secondary | ICD-10-CM

## 2012-10-18 DIAGNOSIS — K219 Gastro-esophageal reflux disease without esophagitis: Secondary | ICD-10-CM

## 2012-10-18 DIAGNOSIS — L255 Unspecified contact dermatitis due to plants, except food: Secondary | ICD-10-CM

## 2012-10-18 NOTE — Progress Notes (Signed)
  Subjective:    Patient ID: Terry Mcpherson, male    DOB: 28-Jun-1961, 51 y.o.   MRN: [id number]  HPI  Need to set up CPX Weight gain Allergic rhinitis GERD on prilosec Seasonal affective disorder Saline lavage and afrin    Review of Systems  Constitutional: Negative for fever and fatigue.  HENT: Negative for hearing loss, congestion, neck pain and postnasal drip.   Eyes: Negative for discharge, redness and visual disturbance.  Respiratory: Negative for cough, shortness of breath and wheezing.   Cardiovascular: Negative for leg swelling.  Gastrointestinal: Negative for abdominal pain, constipation and abdominal distention.  Genitourinary: Negative for urgency and frequency.  Musculoskeletal: Negative for joint swelling and arthralgias.  Skin: Negative for color change and rash.  Neurological: Negative for weakness and light-headedness.  Hematological: Negative for adenopathy.  Psychiatric/Behavioral: Negative for behavioral problems.       Past Medical History  Diagnosis Date  . Depression   . GERD (gastroesophageal reflux disease)   . Allergy   . Chronic sinusitis   . Anxiety   . Anxiety     History   Social History  . Marital Status: Divorced    Spouse Name: N/A    Number of Children: N/A  . Years of Education: N/A   Occupational History  . Not on file.   Social History Main Topics  . Smoking status: Never Smoker   . Smokeless tobacco: Not on file  . Alcohol Use: No  . Drug Use: No  . Sexually Active: Not on file   Other Topics Concern  . Not on file   Social History Narrative  . No narrative on file    Past Surgical History  Procedure Date  . Egd with dilatation     No family history on file.  Allergies  Allergen Reactions  . Penicillins     Current Outpatient Prescriptions on File Prior to Visit  Medication Sig Dispense Refill  . citalopram (CELEXA) 20 MG tablet TAKE ONE TABLET BY MOUTH ONE TIME DAILY  90 tablet  3  . omeprazole  (PRILOSEC) 20 MG capsule Take 20 mg by mouth daily.        BP 130/84  Pulse 76  Temp 98.2 F (36.8 C)  Resp 16  Ht 6\' 1"  (1.854 m)  Wt 218 lb (98.884 kg)  BMI 28.76 kg/m2    Objective:   Physical Exam  Nursing note and vitals reviewed. Constitutional: He is oriented to person, place, and time. He appears well-developed and well-nourished.  HENT:  Head: Normocephalic and atraumatic.  Eyes: Conjunctivae normal are normal. Pupils are equal, round, and reactive to light.  Neck: Normal range of motion. Neck supple.  Cardiovascular: Normal rate and regular rhythm.   Pulmonary/Chest: Effort normal and breath sounds normal.  Abdominal: Soft. Bowel sounds are normal.  Neurological: He is alert and oriented to person, place, and time.  Skin: Skin is warm and dry.          Assessment & Plan:  Mood stable on medications Reviewed use of antidepressant Allergies use of nasal steroid discussed gerd stable  Set up CPX

## 2012-12-04 ENCOUNTER — Encounter: Payer: Self-pay | Admitting: Internal Medicine

## 2012-12-04 NOTE — Patient Instructions (Signed)
The patient is instructed to continue all medications as prescribed. Schedule followup with check out clerk upon leaving the clinic  

## 2012-12-05 ENCOUNTER — Ambulatory Visit: Payer: 59 | Admitting: Internal Medicine

## 2013-01-09 ENCOUNTER — Other Ambulatory Visit (INDEPENDENT_AMBULATORY_CARE_PROVIDER_SITE_OTHER): Payer: 59

## 2013-01-09 DIAGNOSIS — Z0000 Encounter for general adult medical examination without abnormal findings: Secondary | ICD-10-CM

## 2013-01-09 LAB — HEPATIC FUNCTION PANEL
ALT: 32 U/L (ref 0–53)
Albumin: 4 g/dL (ref 3.5–5.2)
Total Bilirubin: 0.7 mg/dL (ref 0.3–1.2)

## 2013-01-09 LAB — POCT URINALYSIS DIPSTICK
Blood, UA: NEGATIVE
Glucose, UA: NEGATIVE
Nitrite, UA: NEGATIVE
Protein, UA: NEGATIVE
Urobilinogen, UA: 0.2

## 2013-01-09 LAB — LIPID PANEL
LDL Cholesterol: 119 mg/dL — ABNORMAL HIGH (ref 0–99)
Total CHOL/HDL Ratio: 6
Triglycerides: 167 mg/dL — ABNORMAL HIGH (ref 0.0–149.0)

## 2013-01-09 LAB — TSH: TSH: 1.21 u[IU]/mL (ref 0.35–5.50)

## 2013-01-09 LAB — BASIC METABOLIC PANEL
CO2: 31 mEq/L (ref 19–32)
Chloride: 106 mEq/L (ref 96–112)
Sodium: 142 mEq/L (ref 135–145)

## 2013-01-09 LAB — CBC WITH DIFFERENTIAL/PLATELET
Basophils Relative: 0.8 % (ref 0.0–3.0)
Eosinophils Absolute: 0.3 10*3/uL (ref 0.0–0.7)
Hemoglobin: 15.3 g/dL (ref 13.0–17.0)
Lymphs Abs: 2 10*3/uL (ref 0.7–4.0)
MCHC: 33.4 g/dL (ref 30.0–36.0)
MCV: 91.8 fl (ref 78.0–100.0)
Monocytes Absolute: 0.7 10*3/uL (ref 0.1–1.0)
Neutro Abs: 3.7 10*3/uL (ref 1.4–7.7)
RBC: 5 Mil/uL (ref 4.22–5.81)

## 2013-01-09 LAB — PSA: PSA: 1.14 ng/mL (ref 0.10–4.00)

## 2013-01-16 ENCOUNTER — Ambulatory Visit (INDEPENDENT_AMBULATORY_CARE_PROVIDER_SITE_OTHER): Payer: 59 | Admitting: Internal Medicine

## 2013-01-16 ENCOUNTER — Encounter: Payer: Self-pay | Admitting: Internal Medicine

## 2013-01-16 VITALS — BP 132/80 | HR 76 | Temp 98.6°F | Resp 16 | Ht 75.0 in | Wt 216.0 lb

## 2013-01-16 DIAGNOSIS — Z0000 Encounter for general adult medical examination without abnormal findings: Secondary | ICD-10-CM

## 2013-01-16 NOTE — Patient Instructions (Signed)
The patient is instructed to continue all medications as prescribed. Schedule followup with check out clerk upon leaving the clinic  

## 2013-01-16 NOTE — Progress Notes (Signed)
Subjective:    Patient ID: Terry Mcpherson, male    DOB: 07-18-1961, 52 y.o.   MRN: [id number]  HPI  CPX   Review of Systems  Constitutional: Negative for fever and fatigue.  HENT: Negative for hearing loss, congestion, neck pain and postnasal drip.   Eyes: Negative for discharge, redness and visual disturbance.  Respiratory: Negative for cough, shortness of breath and wheezing.   Cardiovascular: Negative for leg swelling.  Gastrointestinal: Negative for abdominal pain, constipation and abdominal distention.  Genitourinary: Negative for urgency and frequency.  Musculoskeletal: Negative for joint swelling and arthralgias.  Skin: Negative for color change and rash.  Neurological: Negative for weakness and light-headedness.  Hematological: Negative for adenopathy.  Psychiatric/Behavioral: Negative for behavioral problems.   Past Medical History  Diagnosis Date  . Depression   . GERD (gastroesophageal reflux disease)   . Allergy   . Chronic sinusitis   . Anxiety   . Anxiety     History   Social History  . Marital Status: Divorced    Spouse Name: N/A    Number of Children: N/A  . Years of Education: N/A   Occupational History  . Not on file.   Social History Main Topics  . Smoking status: Never Smoker   . Smokeless tobacco: Not on file  . Alcohol Use: No  . Drug Use: No  . Sexually Active: Not on file   Other Topics Concern  . Not on file   Social History Narrative  . No narrative on file    Past Surgical History  Procedure Laterality Date  . Egd with dilatation      No family history on file.  Allergies  Allergen Reactions  . Penicillins     Current Outpatient Prescriptions on File Prior to Visit  Medication Sig Dispense Refill  . citalopram (CELEXA) 20 MG tablet TAKE ONE TABLET BY MOUTH ONE TIME DAILY  90 tablet  3  . omeprazole (PRILOSEC) 20 MG capsule Take 20 mg by mouth daily.       No current facility-administered medications on file prior to  visit.    BP 132/80  Pulse 76  Temp(Src) 98.6 F (37 C)  Resp 16  Ht 6\' 3"  (1.905 m)  Wt 216 lb (97.977 kg)  BMI 27 kg/m2       Objective:   Physical Exam  Nursing note and vitals reviewed. Constitutional: He is oriented to person, place, and time. He appears well-developed and well-nourished.  HENT:  Head: Normocephalic and atraumatic.  Eyes: Conjunctivae are normal. Pupils are equal, round, and reactive to light.  Neck: Normal range of motion. Neck supple.  Cardiovascular: Normal rate and regular rhythm.   Pulmonary/Chest: Effort normal and breath sounds normal.  Abdominal: Soft. Bowel sounds are normal.  Genitourinary: Rectum normal and prostate normal.  Musculoskeletal: Normal range of motion.  Neurological: He is alert and oriented to person, place, and time.  Skin: Skin is warm and dry.          Assessment & Plan:   Patient presents for yearly preventative medicine examination.   all immunizations and health maintenance protocols were reviewed with the patient and they are up to date with these protocols.   screening laboratory values were reviewed with the patient including screening of hyperlipidemia PSA renal function and hepatic function.   There medications past medical history social history problem list and allergies were reviewed in detail.   Goals were established with regard to weight loss exercise diet  in compliance with medications  Discussed the TG levels

## 2013-04-05 ENCOUNTER — Ambulatory Visit (INDEPENDENT_AMBULATORY_CARE_PROVIDER_SITE_OTHER): Payer: 59 | Admitting: Family Medicine

## 2013-04-05 ENCOUNTER — Encounter: Payer: Self-pay | Admitting: Family Medicine

## 2013-04-05 VITALS — BP 110/70 | Temp 98.4°F | Wt 221.0 lb

## 2013-04-05 DIAGNOSIS — L259 Unspecified contact dermatitis, unspecified cause: Secondary | ICD-10-CM

## 2013-04-05 MED ORDER — METHYLPREDNISOLONE ACETATE 80 MG/ML IJ SUSP: 120.0000 mg | Freq: Once | INTRAMUSCULAR | Status: DC

## 2013-04-05 MED ORDER — PREDNISONE 10 MG PO TABS: ORAL_TABLET | ORAL | Status: DC

## 2013-04-05 NOTE — Addendum Note (Signed)
Addended by: Melchor Amour on: 04/05/2013 03:32 PM   Modules accepted: Orders

## 2013-04-05 NOTE — Progress Notes (Signed)
  Subjective:    Patient ID: Terry Mcpherson, male    DOB: [birth year], 52 y.o.   MRN: [id number]  HPI Acute visit Pruritic rash mostly involving left face and hands and trunk Exposure to poison ivy about 3 days ago. Similar reactions in past. Improved with steroid injections in past Exacerbated by heat. No alleviating factors.  Past Medical History  Diagnosis Date  . Depression   . GERD (gastroesophageal reflux disease)   . Allergy   . Chronic sinusitis   . Anxiety   . Anxiety    Past Surgical History  Procedure Laterality Date  . Egd with dilatation      reports that he has never smoked. He does not have any smokeless tobacco history on file. He reports that he does not drink alcohol or use illicit drugs. family history is not on file. Allergies  Allergen Reactions  . Penicillins       Review of Systems  Constitutional: Negative for fever and chills.  Skin: Positive for rash.       Objective:   Physical Exam  Constitutional: He appears well-developed and well-nourished.  Cardiovascular: Normal rate and regular rhythm.   Pulmonary/Chest: Effort normal and breath sounds normal. No respiratory distress. He has no wheezes. He has no rales.  Skin: Rash noted.  Slightly raised vesicular rash left face hands and minimal involvement forearms          Assessment & Plan:  Contact dermatitis. Discussed options for treatment. Patient requesting injection. Depo-Medrol 120 mg IM. Oral prednisone only if not fully resolving with above

## 2013-04-05 NOTE — Patient Instructions (Signed)

## 2013-04-21 ENCOUNTER — Telehealth: Payer: Self-pay | Admitting: Internal Medicine

## 2013-06-27 ENCOUNTER — Other Ambulatory Visit: Payer: Self-pay | Admitting: Internal Medicine

## 2013-06-27 NOTE — Telephone Encounter (Signed)
Close encounter 

## 2014-03-01 ENCOUNTER — Other Ambulatory Visit: Payer: Self-pay | Admitting: Internal Medicine

## 2014-05-17 ENCOUNTER — Other Ambulatory Visit: Payer: Self-pay | Admitting: Internal Medicine

## 2014-05-28 ENCOUNTER — Telehealth: Payer: Self-pay | Admitting: Internal Medicine

## 2014-05-28 NOTE — Telephone Encounter (Signed)
[id number] - [place],  - [address] states pt needs re-fill on citalopram (CELEXA) 20 MG tablet

## 2014-05-29 NOTE — Telephone Encounter (Signed)
Pt is scheduled to see Matt on 05/30/14

## 2014-05-29 NOTE — Telephone Encounter (Signed)
Pt needs an appt

## 2014-05-30 ENCOUNTER — Ambulatory Visit (INDEPENDENT_AMBULATORY_CARE_PROVIDER_SITE_OTHER): Payer: 59 | Admitting: Physician Assistant

## 2014-05-30 ENCOUNTER — Encounter: Payer: Self-pay | Admitting: Physician Assistant

## 2014-05-30 VITALS — BP 108/76 | HR 66 | Temp 98.4°F | Resp 18 | Wt 218.0 lb

## 2014-05-30 DIAGNOSIS — N529 Male erectile dysfunction, unspecified: Secondary | ICD-10-CM

## 2014-05-30 DIAGNOSIS — F329 Major depressive disorder, single episode, unspecified: Secondary | ICD-10-CM

## 2014-05-30 DIAGNOSIS — Z0000 Encounter for general adult medical examination without abnormal findings: Secondary | ICD-10-CM

## 2014-05-30 DIAGNOSIS — F32A Depression, unspecified: Secondary | ICD-10-CM

## 2014-05-30 DIAGNOSIS — F3289 Other specified depressive episodes: Secondary | ICD-10-CM

## 2014-05-30 MED ORDER — CITALOPRAM HYDROBROMIDE 20 MG PO TABS: ORAL_TABLET | ORAL | Status: DC

## 2014-05-30 NOTE — Progress Notes (Signed)
Subjective:    Patient ID: Terry Mcpherson, male    DOB: 10/17/1961, 53 y.o.   MRN: [id number]  HPI Patient is a 53 y.o. male presenting for medication management. Pt states that he has been on citalopram for over 10 years. This medication is successfully treating his conditions, he is tolerating this medication well. Pt denies any adverse effects to medication. Pt states that about twice a year he goes through a couple day period that he calls the "blues" which are associated with seasonal changes as he has noticed. He sees the [facility], which is going well. He denies suicidal or homicidal ideation. He denies F/C/N/V/D/SOB/CP/HA/Syncope.  He has also been noticing a decrease in erectile function over the past 4 or 5 months. He is requesting a trial of Cialis if possible. Pt has done some research at home and has decided that he would like to try cialis over the other options.  Review of Systems As per HPI and are otherwise negative.   Past Medical History  Diagnosis Date  . Depression   . GERD (gastroesophageal reflux disease)   . Allergy   . Chronic sinusitis   . Anxiety   . Anxiety     History   Social History  . Marital Status: Divorced    Spouse Name: N/A    Number of Children: N/A  . Years of Education: N/A   Occupational History  . Not on file.   Social History Main Topics  . Smoking status: Never Smoker   . Smokeless tobacco: Not on file  . Alcohol Use: No  . Drug Use: No  . Sexual Activity: Not on file   Other Topics Concern  . Not on file   Social History Narrative  . No narrative on file    Past Surgical History  Procedure Laterality Date  . Egd with dilatation      No family history on file.  Allergies  Allergen Reactions  . Penicillins     Current Outpatient Prescriptions on File Prior to Visit  Medication Sig Dispense Refill  . citalopram (CELEXA) 20 MG tablet TAKE ONE TABLET BY MOUTH ONE TIME DAILY   90 tablet  0  .  omeprazole (PRILOSEC) 20 MG capsule Take 20 mg by mouth daily.       No current facility-administered medications on file prior to visit.    EXAM: BP 108/76  Pulse 66  Temp(Src) 98.4 F (36.9 C) (Oral)  Resp 18  Wt 218 lb (98.884 kg)     Objective:   Physical Exam  Nursing note and vitals reviewed. Constitutional: He is oriented to person, place, and time. He appears well-developed and well-nourished. No distress.  HENT:  Head: Normocephalic and atraumatic.  Eyes: Conjunctivae and EOM are normal. Pupils are equal, round, and reactive to light.  Neck: Normal range of motion.  Cardiovascular: Normal rate, regular rhythm and intact distal pulses.   Pulmonary/Chest: Effort normal and breath sounds normal. No respiratory distress. He exhibits no tenderness.  Musculoskeletal: Normal range of motion.  Neurological: He is alert and oriented to person, place, and time.  Skin: Skin is warm and dry. No rash noted. He is not diaphoretic. No erythema. No pallor.  Psychiatric: He has a normal mood and affect. His behavior is normal. Judgment and thought content normal.     Lab Results  Component Value Date   WBC 6.7 01/09/2013   HGB 15.3 01/09/2013   HCT 45.9 01/09/2013   PLT  312.0 01/09/2013   GLUCOSE 94 01/09/2013   CHOL 182 01/09/2013   TRIG 167.0* 01/09/2013   HDL 29.90* 01/09/2013   LDLCALC 119* 01/09/2013   ALT 32 01/09/2013   AST 27 01/09/2013   NA 142 01/09/2013   K 4.6 01/09/2013   CL 106 01/09/2013   CREATININE 1.0 01/09/2013   BUN 13 01/09/2013   CO2 31 01/09/2013   TSH 1.21 01/09/2013   PSA 1.14 01/09/2013        Assessment & Plan:  Terry PicketScott was seen today for medication refill.  Diagnoses and associated orders for this visit:  Depression Comments: Stable with citalopram, tolerating well, no adverse effects, will refill. - citalopram (CELEXA) 20 MG tablet; TAKE ONE TABLET BY MOUTH ONE TIME DAILY  Difficulty attaining erection Comments: Pt wondering about Cialis trial. Needs  physical. Will Rx trial if Physical is normal. - Testosterone, Free, Total; Future  Preventative health care - CBC with Differential; Future - Basic Metabolic Panel; Future - Hepatic Function Panel; Future - Lipid Panel; Future - TSH; Future - Testosterone, Free, Total; Future - POCT UA - Glucose/Protein; Future    Pt needs physical, so will draw labs and check blood work prior to prescribing trial of Cialis. Pt is amenable to this plan.  Return precautions provided, and patient handout on citalopram.  Plan to follow up as needed, or for worsening or persistent symptoms despite treatment.  Patient Instructions  We have refilled your citalopram.  You'll need to schedule physical prior to leaving today.  You also schedule an appointment to come in fasting to have your lab work drawn.  Assuming that the physical in the lab work are all normal, we can do a trial of Cialis.  If emergency symptoms discussed during visit developed, seek medical attention immediately.  Followup as needed, or for worsening or persistent symptoms despite treatment.

## 2014-05-30 NOTE — Patient Instructions (Signed)
We have refilled your citalopram.  You'll need to schedule physical prior to leaving today.  You also schedule an appointment to come in fasting to have your lab work drawn.  Assuming that the physical in the lab work are all normal, we can do a trial of Cialis.  If emergency symptoms discussed during visit developed, seek medical attention immediately.  Followup as needed, or for worsening or persistent symptoms despite treatment.    Citalopram tablets What is this medicine? CITALOPRAM (sye TAL oh pram) is a medicine for depression. This medicine may be used for other purposes; ask your health care provider or pharmacist if you have questions. COMMON BRAND NAME(S): Celexa What should I tell my health care provider before I take this medicine? They need to know if you have any of these conditions: -bipolar disorder or a family history of bipolar disorder -diabetes -glaucoma -heart disease -history of irregular heartbeat -kidney or liver disease -low levels of magnesium or potassium in the blood -receiving electroconvulsive therapy -seizures (convulsions) -suicidal thoughts or a previous suicide attempt -an unusual or allergic reaction to citalopram, escitalopram, other medicines, foods, dyes, or preservatives -pregnant or trying to become pregnant -breast-feeding How should I use this medicine? Take this medicine by mouth with a glass of water. Follow the directions on the prescription label. You can take it with or without food. Take your medicine at regular intervals. Do not take your medicine more often than directed. Do not stop taking this medicine suddenly except upon the advice of your doctor. Stopping this medicine too quickly may cause serious side effects or your condition may worsen. A special MedGuide will be given to you by the pharmacist with each prescription and refill. Be sure to read this information carefully each time. Talk to your pediatrician regarding the  use of this medicine in children. Special care may be needed. Patients over 53 years old may have a stronger reaction and need a smaller dose. Overdosage: If you think you have taken too much of this medicine contact a poison control center or emergency room at once. NOTE: This medicine is only for you. Do not share this medicine with others. What if I miss a dose? If you miss a dose, take it as soon as you can. If it is almost time for your next dose, take only that dose. Do not take double or extra doses. What may interact with this medicine? Do not take this medicine with any of the following medications: -certain medicines for fungal infections like fluconazole, itraconazole, ketoconazole, posaconazole, voriconazole -cisapride -dofetilide -dronedarone -escitalopram -linezolid -MAOIs like Carbex, Eldepryl, Marplan, Nardil, and Parnate -methylene blue (injected into a vein) -pimozide -thioridazine -ziprasidone This medicine may also interact with the following medications: -alcohol -aspirin and aspirin-like medicines -carbamazepine -certain medicines for depression, anxiety, or psychotic disturbances -certain medicines for infections like chloroquine, clarithromycin, erythromycin, furazolidone, isoniazid, pentamidine -certain medicines for migraine headaches like almotriptan, eletriptan, frovatriptan, naratriptan, rizatriptan, sumatriptan, zolmitriptan -certain medicines for sleep -certain medicines that treat or prevent blood clots like dalteparin, enoxaparin, warfarin -cimetidine -diuretics -fentanyl -lithium -methadone -metoprolol -NSAIDs, medicines for pain and inflammation, like ibuprofen or naproxen -omeprazole -other medicines that prolong the QT interval (cause an abnormal heart rhythm) -procarbazine -rasagiline -supplements like St. John's wort, kava kava, valerian -tramadol -tryptophan This list may not describe all possible interactions. Give your health care  provider a list of all the medicines, herbs, non-prescription drugs, or dietary supplements you use. Also tell them if you smoke, drink alcohol,  or use illegal drugs. Some items may interact with your medicine. What should I watch for while using this medicine? Tell your doctor if your symptoms do not get better or if they get worse. Visit your doctor or health care professional for regular checks on your progress. Because it may take several weeks to see the full effects of this medicine, it is important to continue your treatment as prescribed by your doctor. Patients and their families should watch out for new or worsening thoughts of suicide or depression. Also watch out for sudden changes in feelings such as feeling anxious, agitated, panicky, irritable, hostile, aggressive, impulsive, severely restless, overly excited and hyperactive, or not being able to sleep. If this happens, especially at the beginning of treatment or after a change in dose, call your health care professional. Bonita QuinYou may get drowsy or dizzy. Do not drive, use machinery, or do anything that needs mental alertness until you know how this medicine affects you. Do not stand or sit up quickly, especially if you are an older patient. This reduces the risk of dizzy or fainting spells. Alcohol may interfere with the effect of this medicine. Avoid alcoholic drinks. Your mouth may get dry. Chewing sugarless gum or sucking hard candy, and drinking plenty of water will help. Contact your doctor if the problem does not go away or is severe. What side effects may I notice from receiving this medicine? Side effects that you should report to your doctor or health care professional as soon as possible: -allergic reactions like skin rash, itching or hives, swelling of the face, lips, or tongue -chest pain -confusion -dizziness -fast, irregular heartbeat -fast talking and excited feelings or actions that are out of control -feeling faint or  lightheaded, falls -hallucination, loss of contact with reality -seizures -shortness of breath -suicidal thoughts or other mood changes -unusual bleeding or bruising Side effects that usually do not require medical attention (report to your doctor or health care professional if they continue or are bothersome): -blurred vision -change in appetite -change in sex drive or performance -headache -increased sweating -nausea -trouble sleeping This list may not describe all possible side effects. Call your doctor for medical advice about side effects. You may report side effects to FDA at 1-800-FDA-1088. Where should I keep my medicine? Keep out of reach of children. Store at room temperature between 15 and 30 degrees C (59 and 86 degrees F). Throw away any unused medicine after the expiration date. NOTE: This sheet is a summary. It may not cover all possible information. If you have questions about this medicine, talk to your doctor, pharmacist, or health care provider.  2015, Elsevier/Gold Standard. (2013-06-02 13:19:48)

## 2014-05-30 NOTE — Progress Notes (Signed)
Pre visit review using our clinic review tool, if applicable. No additional management support is needed unless otherwise documented below in the visit note. 

## 2014-05-31 ENCOUNTER — Other Ambulatory Visit (INDEPENDENT_AMBULATORY_CARE_PROVIDER_SITE_OTHER): Payer: 59

## 2014-05-31 DIAGNOSIS — Z0000 Encounter for general adult medical examination without abnormal findings: Secondary | ICD-10-CM

## 2014-05-31 DIAGNOSIS — N529 Male erectile dysfunction, unspecified: Secondary | ICD-10-CM

## 2014-05-31 LAB — CBC WITH DIFFERENTIAL/PLATELET
BASOS PCT: 0.8 % (ref 0.0–3.0)
Basophils Absolute: 0.1 10*3/uL (ref 0.0–0.1)
EOS PCT: 4.1 % (ref 0.0–5.0)
Eosinophils Absolute: 0.3 10*3/uL (ref 0.0–0.7)
HEMATOCRIT: 47.3 % (ref 39.0–52.0)
Hemoglobin: 15.9 g/dL (ref 13.0–17.0)
Lymphocytes Relative: 30.1 % (ref 12.0–46.0)
Lymphs Abs: 2.2 10*3/uL (ref 0.7–4.0)
MCHC: 33.5 g/dL (ref 30.0–36.0)
MCV: 93.7 fl (ref 78.0–100.0)
MONO ABS: 0.7 10*3/uL (ref 0.1–1.0)
MONOS PCT: 9.7 % (ref 3.0–12.0)
NEUTROS PCT: 55.3 % (ref 43.0–77.0)
Neutro Abs: 4 10*3/uL (ref 1.4–7.7)
Platelets: 344 10*3/uL (ref 150.0–400.0)
RBC: 5.04 Mil/uL (ref 4.22–5.81)
RDW: 12.8 % (ref 11.5–15.5)
WBC: 7.3 10*3/uL (ref 4.0–10.5)

## 2014-05-31 LAB — HEPATIC FUNCTION PANEL
ALBUMIN: 4 g/dL (ref 3.5–5.2)
ALK PHOS: 64 U/L (ref 39–117)
ALT: 35 U/L (ref 0–53)
AST: 30 U/L (ref 0–37)
Bilirubin, Direct: 0.1 mg/dL (ref 0.0–0.3)
Total Bilirubin: 0.6 mg/dL (ref 0.2–1.2)
Total Protein: 7.1 g/dL (ref 6.0–8.3)

## 2014-05-31 LAB — POCT URINALYSIS DIPSTICK
Bilirubin, UA: NEGATIVE
Blood, UA: NEGATIVE
Glucose, UA: NEGATIVE
Ketones, UA: NEGATIVE
Leukocytes, UA: NEGATIVE
NITRITE UA: NEGATIVE
Spec Grav, UA: 1.015
UROBILINOGEN UA: 1
pH, UA: 7.5

## 2014-05-31 LAB — BASIC METABOLIC PANEL
BUN: 17 mg/dL (ref 6–23)
CO2: 32 mEq/L (ref 19–32)
Calcium: 9.5 mg/dL (ref 8.4–10.5)
Chloride: 105 mEq/L (ref 96–112)
Creatinine, Ser: 0.9 mg/dL (ref 0.4–1.5)
GFR: 98.87 mL/min (ref >60.00)
GLUCOSE: 107 mg/dL — AB (ref 70–99)
Potassium: 4.2 mEq/L (ref 3.5–5.1)
Sodium: 139 mEq/L (ref 135–145)

## 2014-05-31 LAB — PSA: PSA: 1.43 ng/mL (ref 0.10–4.00)

## 2014-05-31 LAB — TSH: TSH: 1.11 u[IU]/mL (ref 0.35–4.50)

## 2014-05-31 LAB — LIPID PANEL
CHOLESTEROL: 205 mg/dL — AB (ref 0–200)
HDL: 31.9 mg/dL — AB (ref >39.00)
LDL Cholesterol: 139 mg/dL — ABNORMAL HIGH (ref 0–99)
NonHDL: 173.1
TRIGLYCERIDES: 172 mg/dL — AB (ref 0.0–149.0)
Total CHOL/HDL Ratio: 6
VLDL: 34.4 mg/dL (ref 0.0–40.0)

## 2014-06-01 LAB — TESTOSTERONE, FREE, TOTAL, SHBG
SEX HORMONE BINDING: 39 nmol/L (ref 13–71)
TESTOSTERONE FREE: 115.8 pg/mL (ref 47.0–244.0)
TESTOSTERONE-% FREE: 1.9 % (ref 1.6–2.9)
Testosterone: 594 ng/dL (ref 300–890)

## 2014-06-07 ENCOUNTER — Ambulatory Visit (INDEPENDENT_AMBULATORY_CARE_PROVIDER_SITE_OTHER): Payer: 59 | Admitting: Physician Assistant

## 2014-06-07 ENCOUNTER — Encounter: Payer: Self-pay | Admitting: Physician Assistant

## 2014-06-07 VITALS — BP 112/76 | HR 66 | Temp 99.0°F | Resp 18 | Ht 75.0 in | Wt 218.0 lb

## 2014-06-07 DIAGNOSIS — Z0000 Encounter for general adult medical examination without abnormal findings: Secondary | ICD-10-CM

## 2014-06-07 DIAGNOSIS — N529 Male erectile dysfunction, unspecified: Secondary | ICD-10-CM

## 2014-06-07 MED ORDER — TADALAFIL 5 MG PO TABS: 5.0000 mg | ORAL_TABLET | Freq: Every day | ORAL | Status: DC | PRN

## 2014-06-07 NOTE — Progress Notes (Signed)
Pre visit review using our clinic review tool, if applicable. No additional management support is needed unless otherwise documented below in the visit note. 

## 2014-06-07 NOTE — Progress Notes (Signed)
Subjective:    Patient ID: Terry Mcpherson, male    DOB: Mar 26, 1961, 53 y.o.   MRN: [id number]  HPI Patient is a 53 y.o. male presenting for annual physical.  Pt is interested in trying Cialis, needed physical prior to starting.  Dentist- [facility]- [facility] exam [facility], 11/2013 Immunizations- UTD Colonoscopy- needs Colonoscopy, will refer for scheduling.  Had Vasectomy 4 years ago with [facility], Dr. Annabell Howells.  Review of Systems  Constitutional: Negative for fever, chills, diaphoresis, activity change, appetite change, fatigue and unexpected weight change.  HENT: Negative.  Negative for dental problem, hearing loss and trouble swallowing.   Eyes: Negative.  Negative for photophobia and visual disturbance.  Respiratory: Negative for apnea, cough, choking, chest tightness, shortness of breath, wheezing and stridor.   Cardiovascular: Negative for chest pain, palpitations and leg swelling.  Gastrointestinal: Negative.  Negative for nausea, vomiting, abdominal pain, diarrhea and blood in stool.  Endocrine: Negative.  Negative for cold intolerance and heat intolerance.  Genitourinary: Negative.   Musculoskeletal: Negative.   Skin: Negative.  Negative for color change, pallor, rash and wound.  Allergic/Immunologic: Negative.   Neurological: Negative.  Negative for dizziness, syncope, weakness, numbness and headaches.  Hematological: Negative.   Psychiatric/Behavioral: Negative.  Negative for suicidal ideas, hallucinations, behavioral problems, confusion, sleep disturbance, self-injury and agitation. The patient is not nervous/anxious.   All other systems reviewed and are negative.     Past Medical History  Diagnosis Date  . Depression   . GERD (gastroesophageal reflux disease)   . Allergy   . Chronic sinusitis   . Anxiety   . Anxiety     History   Social History  . Marital Status: Divorced    Spouse Name: N/A    Number of Children: N/A  . Years of  Education: N/A   Occupational History  . Not on file.   Social History Main Topics  . Smoking status: Never Smoker   . Smokeless tobacco: Not on file  . Alcohol Use: No  . Drug Use: No  . Sexual Activity: Not on file   Other Topics Concern  . Not on file   Social History Narrative  . No narrative on file    Past Surgical History  Procedure Laterality Date  . Egd with dilatation      No family history on file.  Allergies  Allergen Reactions  . Penicillins     Current Outpatient Prescriptions on File Prior to Visit  Medication Sig Dispense Refill  . citalopram (CELEXA) 20 MG tablet TAKE ONE TABLET BY MOUTH ONE TIME DAILY  90 tablet  2  . etodolac (LODINE) 400 MG tablet       . omeprazole (PRILOSEC) 20 MG capsule Take 20 mg by mouth daily.       No current facility-administered medications on file prior to visit.   The entire PFS history was reviewed with the pt at time of visit.   EXAM: BP 112/76  Pulse 66  Temp(Src) 99 F (37.2 C) (Oral)  Resp 18  Ht 6\' 3"  (1.905 m)  Wt 218 lb (98.884 kg)  BMI 27.25 kg/m2     Objective:   Physical Exam  Nursing note and vitals reviewed. Constitutional: He is oriented to person, place, and time. He appears well-developed and well-nourished. No distress.  HENT:  Head: Normocephalic and atraumatic.  Right Ear: External ear normal.  Left Ear: External ear normal.  Nose: Nose normal.  Mouth/Throat: Oropharynx is clear  and moist. No oropharyngeal exudate.  Bilateral TMs normal.  Eyes: Conjunctivae and EOM are normal. Pupils are equal, round, and reactive to light. Right eye exhibits no discharge. Left eye exhibits no discharge. No scleral icterus.  Neck: Normal range of motion. Neck supple. No JVD present. No tracheal deviation present. No thyromegaly present.  Cardiovascular: Normal rate, regular rhythm, normal heart sounds and intact distal pulses.  Exam reveals no gallop and no friction rub.   No murmur  heard. Pulmonary/Chest: Effort normal and breath sounds normal. No stridor. No respiratory distress. He has no wheezes. He has no rales. He exhibits no tenderness.  Abdominal: Soft. Bowel sounds are normal. He exhibits no distension and no mass. There is no tenderness. There is no rebound and no guarding.  Genitourinary:  Declined.  Musculoskeletal: Normal range of motion. He exhibits no edema and no tenderness.  Lymphadenopathy:    He has no cervical adenopathy.  Neurological: He is alert and oriented to person, place, and time. He has normal reflexes. No cranial nerve deficit. He exhibits normal muscle tone. Coordination normal.  Skin: Skin is warm and dry. No rash noted. He is not diaphoretic. No erythema. No pallor.  Psychiatric: He has a normal mood and affect. His behavior is normal. Judgment and thought content normal.    Lab Results  Component Value Date   WBC 7.3 05/31/2014   HGB 15.9 05/31/2014   HCT 47.3 05/31/2014   PLT 344.0 05/31/2014   GLUCOSE 107* 05/31/2014   CHOL 205* 05/31/2014   TRIG 172.0* 05/31/2014   HDL 31.90* 05/31/2014   LDLCALC 139* 05/31/2014   ALT 35 05/31/2014   AST 30 05/31/2014   NA 139 05/31/2014   K 4.2 05/31/2014   CL 105 05/31/2014   CREATININE 0.9 05/31/2014   BUN 17 05/31/2014   CO2 32 05/31/2014   TSH 1.11 05/31/2014   PSA 1.43 05/31/2014    EKG: normal EKG, normal sinus rhythm, there are no previous tracings available for comparison.      Assessment & Plan:  Terry PicketScott was seen today for annual exam.  Diagnoses and associated orders for this visit:  Routine general medical examination at a health care facility Comments: Over all good health. Health Maintenance UTD, needs colonoscopy, will refer. - EKG 12-Lead - Ambulatory referral to Gastroenterology  Difficulty attaining erection Comments: Will do trial of cialis. - tadalafil (CIALIS) 5 MG tablet; Take 1 tablet (5 mg total) by mouth daily as needed for erectile dysfunction.    Return precautions provided, and  patient handout on health maintenance and cialis.  Pt will follow up if any reaction develops from cialis, or if his symptoms persist despite treatment.  Plan to follow up as needed for all other concerns.    Patient Instructions  Trial of Cialis.  Try to maintained a healthy varied diet, and maintain your exercise.  If you notice any side effects to medications, please contact the office.  Please return as needed for all other concerns.

## 2014-06-07 NOTE — Patient Instructions (Signed)
Trial of Cialis.  Try to maintained a healthy varied diet, and maintain your exercise.  If you notice any side effects to medications, please contact the office.  Please return as needed for all other concerns.     Health Maintenance A healthy lifestyle and preventative care can promote health and wellness.  Maintain regular health, dental, and eye exams.  Eat a healthy diet. Foods like vegetables, fruits, whole grains, low-fat dairy products, and lean protein foods contain the nutrients you need and are low in calories. Decrease your intake of foods high in solid fats, added sugars, and salt. Get information about a proper diet from your health care provider, if necessary.  Regular physical exercise is one of the most important things you can do for your health. Most adults should get at least 150 minutes of moderate-intensity exercise (any activity that increases your heart rate and causes you to sweat) each week. In addition, most adults need muscle-strengthening exercises on 2 or more days a week.   Maintain a healthy weight. The body mass index (BMI) is a screening tool to identify possible weight problems. It provides an estimate of body fat based on height and weight. Your health care provider can find your BMI and can help you achieve or maintain a healthy weight. For males 20 years and older:  A BMI below 18.5 is considered underweight.  A BMI of 18.5 to 24.9 is normal.  A BMI of 25 to 29.9 is considered overweight.  A BMI of 30 and above is considered obese.  Maintain normal blood lipids and cholesterol by exercising and minimizing your intake of saturated fat. Eat a balanced diet with plenty of fruits and vegetables. Blood tests for lipids and cholesterol should begin at age 29 and be repeated every 5 years. If your lipid or cholesterol levels are high, you are over age 25, or you are at high risk for heart disease, you may need your cholesterol levels checked more  frequently.Ongoing high lipid and cholesterol levels should be treated with medicines if diet and exercise are not working.  If you smoke, find out from your health care provider how to quit. If you do not use tobacco, do not start.  Lung cancer screening is recommended for adults aged 55-80 years who are at high risk for developing lung cancer because of a history of smoking. A yearly low-dose CT scan of the lungs is recommended for people who have at least a 30-pack-year history of smoking and are current smokers or have quit within the past 15 years. A pack year of smoking is smoking an average of 1 pack of cigarettes a day for 1 year (for example, a 30-pack-year history of smoking could mean smoking 1 pack a day for 30 years or 2 packs a day for 15 years). Yearly screening should continue until the smoker has stopped smoking for at least 15 years. Yearly screening should be stopped for people who develop a health problem that would prevent them from having lung cancer treatment.  If you choose to drink alcohol, do not have more than 2 drinks per day. One drink is considered to be 12 oz (360 mL) of beer, 5 oz (150 mL) of wine, or 1.5 oz (45 mL) of liquor.  Avoid the use of street drugs. Do not share needles with anyone. Ask for help if you need support or instructions about stopping the use of drugs.  High blood pressure causes heart disease and increases the risk of  stroke. Blood pressure should be checked at least every 1-2 years. Ongoing high blood pressure should be treated with medicines if weight loss and exercise are not effective.  If you are 50-28 years old, ask your health care provider if you should take aspirin to prevent heart disease.  Diabetes screening involves taking a blood sample to check your fasting blood sugar level. This should be done once every 3 years after age 55 if you are at a normal weight and without risk factors for diabetes. Testing should be considered at a younger  age or be carried out more frequently if you are overweight and have at least 1 risk factor for diabetes.  Colorectal cancer can be detected and often prevented. Most routine colorectal cancer screening begins at the age of 61 and continues through age 34. However, your health care provider may recommend screening at an earlier age if you have risk factors for colon cancer. On a yearly basis, your health care provider may provide home test kits to check for hidden blood in the stool. A small camera at the end of a tube may be used to directly examine the colon (sigmoidoscopy or colonoscopy) to detect the earliest forms of colorectal cancer. Talk to your health care provider about this at age 72 when routine screening begins. A direct exam of the colon should be repeated every 5-10 years through age 7, unless early forms of precancerous polyps or small growths are found.  People who are at an increased risk for hepatitis B should be screened for this virus. You are considered at high risk for hepatitis B if:  You were born in a country where hepatitis B occurs often. Talk with your health care provider about which countries are considered high risk.  Your parents were born in a high-risk country and you have not received a shot to protect against hepatitis B (hepatitis B vaccine).  You have HIV or AIDS.  You use needles to inject street drugs.  You live with, or have sex with, someone who has hepatitis B.  You are a man who has sex with other men (MSM).  You get hemodialysis treatment.  You take certain medicines for conditions like cancer, organ transplantation, and autoimmune conditions.  Hepatitis C blood testing is recommended for all people born from [birth year] through 1965 and any individual with known risk factors for hepatitis C.  Healthy men should no longer receive prostate-specific antigen (PSA) blood tests as part of routine cancer screening. Talk to your health care provider about  prostate cancer screening.  Testicular cancer screening is not recommended for adolescents or adult males who have no symptoms. Screening includes self-exam, a health care provider exam, and other screening tests. Consult with your health care provider about any symptoms you have or any concerns you have about testicular cancer.  Practice safe sex. Use condoms and avoid high-risk sexual practices to reduce the spread of sexually transmitted infections (STIs).  You should be screened for STIs, including gonorrhea and chlamydia if:  You are sexually active and are younger than 24 years.  You are older than 24 years, and your health care provider tells you that you are at risk for this type of infection.  Your sexual activity has changed since you were last screened, and you are at an increased risk for chlamydia or gonorrhea. Ask your health care provider if you are at risk.  If you are at risk of being infected with HIV, it is recommended that  you take a prescription medicine daily to prevent HIV infection. This is called pre-exposure prophylaxis (PrEP). You are considered at risk if:  You are a man who has sex with other men (MSM).  You are a heterosexual man who is sexually active with multiple partners.  You take drugs by injection.  You are sexually active with a partner who has HIV.  Talk with your health care provider about whether you are at high risk of being infected with HIV. If you choose to begin PrEP, you should first be tested for HIV. You should then be tested every 3 months for as long as you are taking PrEP.  Use sunscreen. Apply sunscreen liberally and repeatedly throughout the day. You should seek shade when your shadow is shorter than you. Protect yourself by wearing long sleeves, pants, a wide-brimmed hat, and sunglasses year round whenever you are outdoors.  Tell your health care provider of new moles or changes in moles, especially if there is a change in shape or  color. Also, tell your health care provider if a mole is larger than the size of a pencil eraser.  A one-time screening for abdominal aortic aneurysm (AAA) and surgical repair of large AAAs by ultrasound is recommended for men aged 65-75 years who are current or former smokers.  Stay current with your vaccines (immunizations). Document Released: 05/07/2008 Document Revised: 11/14/2013 Document Reviewed: 04/06/2011 [facility] Patient Information 2015 [place], [place]. This information is not intended to replace advice given to you by your health care provider. Make sure you discuss any questions you have with your health care provider. Tadalafil tablets (Cialis) What is this medicine? TADALAFIL (tah DA la fil) is used to treat erection problems in men. It is also used for enlargement of the prostate gland in men, a condition called benign prostatic hyperplasia or BPH. This medicine improves urine flow and reduces BPH symptoms. This medicine can also treat both erection problems and BPH when they occur together. This medicine may be used for other purposes; ask your health care provider or pharmacist if you have questions. COMMON BRAND NAME(S): Cialis What should I tell my health care provider before I take this medicine? They need to know if you have any of these conditions: -bleeding disorders -eye or vision problems, including a rare inherited eye disease called retinitis pigmentosa -anatomical deformation of the penis, Peyronie's disease, or history of priapism (painful and prolonged erection) -heart disease, angina, a history of heart attack, irregular heart beats, or other heart problems -high or low blood pressure -history of blood diseases, like sickle cell anemia or leukemia -history of stomach bleeding -kidney disease -liver disease -stroke -an unusual or allergic reaction to tadalafil, other medicines, foods, dyes, or preservatives -pregnant or trying to get  pregnant -breast-feeding How should I use this medicine? Take this medicine by mouth with a glass of water. Follow the directions on the prescription label. You may take this medicine with or without meals. When this medicine is used for erection problems, your doctor may prescribe it to be taken once daily or as needed. If you are taking the medicine as needed, you may be able to have sexual activity 30 minutes after taking it and for up to 36 hours after taking it. Whether you are taking the medicine as needed or once daily, you should not take more than one dose per day. If you are taking this medicine for symptoms of benign prostatic hyperplasia (BPH) or to treat both BPH and an erection  problem, take the dose once daily at about the same time each day. Do not take your medicine more often than directed. Talk to your pediatrician regarding the use of this medicine in children. Special care may be needed. Overdosage: If you think you have taken too much of this medicine contact a poison control center or emergency room at once. NOTE: This medicine is only for you. Do not share this medicine with others. What if I miss a dose? If you are taking this medicine as needed for erection problems, this does not apply. If you miss a dose while taking this medicine once daily for an erection problem, benign prostatic hyperplasia, or both, take it as soon as you remember, but do not take more than one dose per day. What may interact with this medicine? Do not take this medicine with any of the following medications: -nitrates like amyl nitrite, isosorbide dinitrate, isosorbide mononitrate, nitroglycerin -other medicines for erectile dysfunction like avanafil, sildenafil, vardenafil -other tadalafil products (Adcirca) This medicine may also interact with the following medications: -certain drugs for high blood pressure -certain drugs for the treatment of HIV infection or AIDS -certain drugs used for fungal  or yeast infections, like fluconazole, itraconazole, ketoconazole, and voriconazole -certain drugs used for seizures like carbamazepine, phenytoin, and phenobarbital -grapefruit juice -macrolide antibiotics like clarithromycin, erythromycin, troleandomycin -medicines for prostate problems -rifabutin, rifampin or rifapentine This list may not describe all possible interactions. Give your health care provider a list of all the medicines, herbs, non-prescription drugs, or dietary supplements you use. Also tell them if you smoke, drink alcohol, or use illegal drugs. Some items may interact with your medicine. What should I watch for while using this medicine? If you notice any changes in your vision while taking this drug, call your doctor or health care professional as soon as possible. Stop using this medicine and call your health care provider right away if you have a loss of sight in one or both eyes. Contact your doctor or health care professional right away if the erection lasts longer than 4 hours or if it becomes painful. This may be a sign of serious problem and must be treated right away to prevent permanent damage. If you experience symptoms of nausea, dizziness, chest pain or arm pain upon initiation of sexual activity after taking this medicine, you should refrain from further activity and call your doctor or health care professional as soon as possible. Do not drink alcohol to excess (examples, 5 glasses of wine or 5 shots of whiskey) when taking this medicine. When taken in excess, alcohol can increase your chances of getting a headache or getting dizzy, increasing your heart rate or lowering your blood pressure. Using this medicine does not protect you or your partner against HIV infection (the virus that causes AIDS) or other sexually transmitted diseases. What side effects may I notice from receiving this medicine? Side effects that you should report to your doctor or health care  professional as soon as possible: -allergic reactions like skin rash, itching or hives, swelling of the face, lips, or tongue -breathing problems -changes in hearing -changes in vision -chest pain -fast, irregular heartbeat -prolonged or painful erection -seizures Side effects that usually do not require medical attention (report to your doctor or health care professional if they continue or are bothersome): -back pain -dizziness -flushing -headache -indigestion -muscle aches -nausea -stuffy or runny nose This list may not describe all possible side effects. Call your doctor for medical advice about  side effects. You may report side effects to FDA at 1-800-FDA-1088. Where should I keep my medicine? Keep out of the reach of children. Store at room temperature between 15 and 30 degrees C (59 and 86 degrees F). Throw away any unused medicine after the expiration date. NOTE: This sheet is a summary. It may not cover all possible information. If you have questions about this medicine, talk to your doctor, pharmacist, or health care provider.  2015, Elsevier/Gold Standard. (2012-11-09 13:42:23)

## 2014-07-10 ENCOUNTER — Ambulatory Visit (INDEPENDENT_AMBULATORY_CARE_PROVIDER_SITE_OTHER): Payer: 59 | Admitting: Psychology

## 2014-07-10 DIAGNOSIS — F341 Dysthymic disorder: Secondary | ICD-10-CM

## 2014-07-19 ENCOUNTER — Encounter: Payer: Self-pay | Admitting: Gastroenterology

## 2014-07-23 ENCOUNTER — Ambulatory Visit (INDEPENDENT_AMBULATORY_CARE_PROVIDER_SITE_OTHER): Payer: 59 | Admitting: Psychology

## 2014-07-23 DIAGNOSIS — F341 Dysthymic disorder: Secondary | ICD-10-CM

## 2014-08-01 ENCOUNTER — Encounter: Payer: Self-pay | Admitting: Gastroenterology

## 2014-08-09 ENCOUNTER — Ambulatory Visit (INDEPENDENT_AMBULATORY_CARE_PROVIDER_SITE_OTHER): Payer: 59 | Admitting: Psychology

## 2014-08-09 DIAGNOSIS — F341 Dysthymic disorder: Secondary | ICD-10-CM

## 2014-08-10 ENCOUNTER — Ambulatory Visit: Payer: 59 | Admitting: Psychology

## 2014-08-28 ENCOUNTER — Other Ambulatory Visit: Payer: Self-pay | Admitting: Physician Assistant

## 2014-08-28 ENCOUNTER — Telehealth: Payer: Self-pay

## 2014-08-28 DIAGNOSIS — N529 Male erectile dysfunction, unspecified: Secondary | ICD-10-CM

## 2014-08-28 MED ORDER — TADALAFIL 5 MG PO TABS: 5.0000 mg | ORAL_TABLET | Freq: Every day | ORAL | Status: DC | PRN

## 2014-08-28 NOTE — Telephone Encounter (Signed)
Okay to refill? 

## 2014-08-28 NOTE — Telephone Encounter (Signed)
Rx request for Cialis. Please send to [facility] on [address]. Pls advise

## 2014-08-28 NOTE — Telephone Encounter (Signed)
rx sent to pharmacy

## 2014-08-29 NOTE — Telephone Encounter (Signed)
Cialis 5 mg, qty 30 per 30 can't be prescribed for erectile dysfunction.  Can you re-send rx for 20 mg qty 6 per 30??  It may still require a PA due to pt's plan.

## 2014-08-30 ENCOUNTER — Ambulatory Visit (INDEPENDENT_AMBULATORY_CARE_PROVIDER_SITE_OTHER): Payer: 59 | Admitting: Psychology

## 2014-08-30 DIAGNOSIS — F341 Dysthymic disorder: Secondary | ICD-10-CM

## 2014-08-30 NOTE — Telephone Encounter (Signed)
Can it be prescribed for difficulty attaining erection? Or does this just mean that insurance wont cover it? The pt may have to pay cash price.   

## 2014-08-31 NOTE — Telephone Encounter (Signed)
Can it be prescribed for difficulty attaining erection? Or does this just mean that insurance wont cover it? The pt may have to pay cash price.

## 2014-09-03 MED ORDER — TADALAFIL 20 MG PO TABS: 10.0000 mg | ORAL_TABLET | Freq: Every day | ORAL | Status: DC | PRN

## 2014-09-03 NOTE — Telephone Encounter (Signed)
Per Terry HazardMatthew rx changed to 20 mg for ED; take 1/2 tablet prn.   Called and spoke with pt and pt is aware to take 1/2 tablet of Cialis  20 mg prn for ED.  Pt will call and [facility] and see how many is covered a month and how much it cost.

## 2014-09-07 ENCOUNTER — Telehealth: Payer: Self-pay | Admitting: Internal Medicine

## 2014-09-07 DIAGNOSIS — N529 Male erectile dysfunction, unspecified: Secondary | ICD-10-CM

## 2014-09-07 MED ORDER — TADALAFIL 5 MG PO TABS: 5.0000 mg | ORAL_TABLET | Freq: Every day | ORAL | Status: DC | PRN

## 2014-09-07 NOTE — Telephone Encounter (Signed)
Rx resent to pharmacy

## 2014-09-07 NOTE — Telephone Encounter (Signed)
Pt request refill tadalafil (CIALIS) 5 MG tablet #30 Pt did not pick up the 20 mg. Prefers to do the 5 mg daily, just like the trial.  Pt will be willing to pay any additional amount insurance does not cover.  [facility]

## 2014-09-10 NOTE — Telephone Encounter (Signed)
Pt's plan will only pay for 3 per 30 days.  Pharmacy is aware.  [id number].

## 2014-09-10 NOTE — Telephone Encounter (Signed)
Left a message for pt to return call 

## 2014-09-11 ENCOUNTER — Ambulatory Visit (AMBULATORY_SURGERY_CENTER): Payer: Self-pay

## 2014-09-11 VITALS — Ht 75.0 in | Wt 225.2 lb

## 2014-09-11 DIAGNOSIS — Z1211 Encounter for screening for malignant neoplasm of colon: Secondary | ICD-10-CM

## 2014-09-11 MED ORDER — MOVIPREP 100 G PO SOLR: ORAL | Status: DC

## 2014-09-11 NOTE — Progress Notes (Signed)
Per pt, no allergies to soy or egg products.Pt not taking any weight loss meds or using  O2 at home.   Pt states he had an endoscopy done 15 years ago but does not remember where or the doctor's name.

## 2014-09-13 ENCOUNTER — Ambulatory Visit (INDEPENDENT_AMBULATORY_CARE_PROVIDER_SITE_OTHER): Payer: 59 | Admitting: Psychology

## 2014-09-13 DIAGNOSIS — F341 Dysthymic disorder: Secondary | ICD-10-CM

## 2014-09-13 NOTE — Telephone Encounter (Signed)
Left a message for return call.  

## 2014-09-17 NOTE — Telephone Encounter (Signed)
Left a message for pt to call office back if he was not able to get his medication.

## 2014-09-19 ENCOUNTER — Encounter: Payer: Self-pay | Admitting: Gastroenterology

## 2014-09-25 ENCOUNTER — Ambulatory Visit (AMBULATORY_SURGERY_CENTER): Payer: 59 | Admitting: Gastroenterology

## 2014-09-25 ENCOUNTER — Encounter: Payer: Self-pay | Admitting: Gastroenterology

## 2014-09-25 VITALS — BP 133/85 | HR 61 | Temp 97.4°F | Resp 16 | Ht 75.0 in | Wt 225.0 lb

## 2014-09-25 DIAGNOSIS — Z1211 Encounter for screening for malignant neoplasm of colon: Secondary | ICD-10-CM

## 2014-09-25 DIAGNOSIS — K5730 Diverticulosis of large intestine without perforation or abscess without bleeding: Secondary | ICD-10-CM

## 2014-09-25 MED ORDER — SODIUM CHLORIDE 0.9 % IV SOLN: 500.0000 mL | INTRAVENOUS | Status: DC

## 2014-09-25 NOTE — Progress Notes (Signed)
Report to PACU, RN, vss, BBS= Clear.  

## 2014-09-25 NOTE — Op Note (Signed)
[facility] [address]  [address] [place], [id number]   COLONOSCOPY PROCEDURE REPORT  PATIENT: Terry Mcpherson, Terry Mcpherson  MR#: [id number] BIRTHDATE: 1961/06/29 , 53  yrs. old GENDER: male ENDOSCOPIST: Rachael Feeaniel P Jacobs, MD REFERRED BY: Donell BeersMatthew Tucker, PA PROCEDURE DATE:  09/25/2014 PROCEDURE:   Colonoscopy, screening First Screening Colonoscopy - Avg.  risk and is 50 yrs.  old or older Yes.  Prior Negative Screening - Now for repeat screening. [place]/A  History of Adenoma - Now for follow-up colonoscopy & has been > or = to 3 yrs.  [place]/A  Polyps Removed Today? No.  Recommend repeat exam, <10 yrs? No. ASA CLASS:   Class II INDICATIONS:average risk for colon cancer. MEDICATIONS: Monitored anesthesia care and Propofol 300 mg IV  DESCRIPTION OF PROCEDURE:   After the risks benefits and alternatives of the procedure were thoroughly explained, informed consent was obtained.  The digital rectal exam revealed no abnormalities of the rectum.   The LB CF-[id number] Loaner [id number] endoscope was introduced through the anus and advanced to the cecum, which was identified by both the appendix and ileocecal valve. No adverse events experienced.   The quality of the prep was good.  The instrument was then slowly withdrawn as the colon was fully examined.  COLON FINDINGS: There was mild diverticulosis noted in the left colon.   The examination was otherwise normal.  Retroflexed views revealed no abnormalities. The time to cecum=3 minutes 45 seconds. Withdrawal time=11 minutes 50 seconds.  The scope was withdrawn and the procedure completed. COMPLICATIONS: There were no immediate complications.  ENDOSCOPIC IMPRESSION: 1.   Mild diverticulosis was noted in the left colon 2.   The examination was otherwise normal; no polyps or cancers  RECOMMENDATIONS: You should continue to follow colorectal cancer screening guidelines for "routine risk" patients with a repeat colonoscopy in 10 years.  eSigned:  Rachael Feeaniel  P Jacobs, MD 09/25/2014 9:10 AM

## 2014-09-25 NOTE — Patient Instructions (Signed)
YOU HAD AN ENDOSCOPIC PROCEDURE TODAY AT THE [facility]: Refer to the procedure report that was given to you for any specific questions about what was found during the examination.  If the procedure report does not answer your questions, please call your gastroenterologist to clarify.  If you requested that your care partner not be given the details of your procedure findings, then the procedure report has been included in a sealed envelope for you to review at your convenience later.  YOU SHOULD EXPECT: Some feelings of bloating in the abdomen. Passage of more gas than usual.  Walking can help get rid of the air that was put into your GI tract during the procedure and reduce the bloating. If you had a lower endoscopy (such as a colonoscopy or flexible sigmoidoscopy) you may notice spotting of blood in your stool or on the toilet paper. If you underwent a bowel prep for your procedure, then you may not have a normal bowel movement for a few days.  DIET: Your first meal following the procedure should be a light meal and then it is ok to progress to your normal diet.  A half-sandwich or bowl of soup is an example of a good first meal.  Heavy or fried foods are harder to digest and may make you feel nauseous or bloated.  Likewise meals heavy in dairy and vegetables can cause extra gas to form and this can also increase the bloating.  Drink plenty of fluids but you should avoid alcoholic beverages for 24 hours.  ACTIVITY: Your care partner should take you home directly after the procedure.  You should plan to take it easy, moving slowly for the rest of the day.  You can resume normal activity the day after the procedure however you should NOT DRIVE or use heavy machinery for 24 hours (because of the sedation medicines used during the test).    SYMPTOMS TO REPORT IMMEDIATELY: A gastroenterologist can be reached at any hour.  During normal business hours, 8:30 AM to 5:00 PM Monday through Friday,  call [phone].  After hours and on weekends, please call the GI answering service at [phone] who will take a message and have the physician on call contact you.   Following lower endoscopy (colonoscopy or flexible sigmoidoscopy):  Excessive amounts of blood in the stool  Significant tenderness or worsening of abdominal pains  Swelling of the abdomen that is new, acute  Fever of 100F or higher    FOLLOW UP: If any biopsies were taken you will be contacted by phone or by letter within the next 1-3 weeks.  Call your gastroenterologist if you have not heard about the biopsies in 3 weeks.  Our staff will call the home number listed on your records the next business day following your procedure to check on you and address any questions or concerns that you may have at that time regarding the information given to you following your procedure. This is a courtesy call and so if there is no answer at the home number and we have not heard from you through the emergency physician on call, we will assume that you have returned to your regular daily activities without incident.  SIGNATURES/CONFIDENTIALITY: You and/or your care partner have signed paperwork which will be entered into your electronic medical record.  These signatures attest to the fact that that the information above on your After Visit Summary has been reviewed and is understood.  Full responsibility of the confidentiality   of this discharge information lies with you and/or your care-partner.     

## 2014-09-26 ENCOUNTER — Telehealth: Payer: Self-pay | Admitting: *Deleted

## 2014-09-26 NOTE — Telephone Encounter (Signed)
  Follow up Call-  Call back number 09/25/2014  Post procedure Call Back phone  # [phone]  Permission to leave phone message Yes     No answer,left message.

## 2014-10-15 ENCOUNTER — Ambulatory Visit (INDEPENDENT_AMBULATORY_CARE_PROVIDER_SITE_OTHER): Payer: 59 | Admitting: Psychology

## 2014-10-15 DIAGNOSIS — F341 Dysthymic disorder: Secondary | ICD-10-CM

## 2014-10-30 ENCOUNTER — Ambulatory Visit: Payer: 59 | Admitting: Psychology

## 2014-11-20 ENCOUNTER — Other Ambulatory Visit: Payer: Self-pay | Admitting: *Deleted

## 2014-11-20 DIAGNOSIS — N529 Male erectile dysfunction, unspecified: Secondary | ICD-10-CM

## 2014-11-20 MED ORDER — TADALAFIL 5 MG PO TABS: 5.0000 mg | ORAL_TABLET | Freq: Every day | ORAL | Status: DC | PRN

## 2014-11-28 ENCOUNTER — Telehealth: Payer: Self-pay | Admitting: Physician Assistant

## 2014-11-28 MED ORDER — TADALAFIL 20 MG PO TABS: 10.0000 mg | ORAL_TABLET | ORAL | Status: DC | PRN

## 2014-11-28 NOTE — Telephone Encounter (Signed)
Rx for Cialis 20 mg/3 tab sent to pharmacy.  Left a message making pt aware rx sent to pharmacy.

## 2014-11-28 NOTE — Telephone Encounter (Signed)
Pt states the rx tadalafil (CIALIS) 5 MG tablet 30 tab is a daily rx and he only needs prn. Pt would like 20 mg/ 3 tabs.  Then he cuts up and his insurance will pay and this will last a month until he can see dr Therapist, [facility] [address] Can you resend this new rx?  Pt has made appt w/ dr hunter for a fu on meds and est on a later date! Thanks!

## 2014-11-28 NOTE — Telephone Encounter (Signed)
Pls advise if ok to send until appt

## 2014-11-28 NOTE — Telephone Encounter (Signed)
That's fine

## 2014-11-29 ENCOUNTER — Ambulatory Visit (INDEPENDENT_AMBULATORY_CARE_PROVIDER_SITE_OTHER): Payer: 59 | Admitting: Psychology

## 2014-11-29 DIAGNOSIS — F341 Dysthymic disorder: Secondary | ICD-10-CM

## 2014-12-24 ENCOUNTER — Encounter: Payer: Self-pay | Admitting: Family Medicine

## 2014-12-24 ENCOUNTER — Ambulatory Visit (INDEPENDENT_AMBULATORY_CARE_PROVIDER_SITE_OTHER): Payer: 59 | Admitting: Family Medicine

## 2014-12-24 VITALS — BP 120/70 | Temp 99.1°F | Wt 224.0 lb

## 2014-12-24 DIAGNOSIS — N529 Male erectile dysfunction, unspecified: Secondary | ICD-10-CM | POA: Insufficient documentation

## 2014-12-24 DIAGNOSIS — F32A Depression, unspecified: Secondary | ICD-10-CM

## 2014-12-24 DIAGNOSIS — N5201 Erectile dysfunction due to arterial insufficiency: Secondary | ICD-10-CM

## 2014-12-24 DIAGNOSIS — F1011 Alcohol abuse, in remission: Secondary | ICD-10-CM | POA: Insufficient documentation

## 2014-12-24 DIAGNOSIS — K219 Gastro-esophageal reflux disease without esophagitis: Secondary | ICD-10-CM

## 2014-12-24 DIAGNOSIS — F329 Major depressive disorder, single episode, unspecified: Secondary | ICD-10-CM

## 2014-12-24 MED ORDER — TADALAFIL 20 MG PO TABS: 10.0000 mg | ORAL_TABLET | ORAL | Status: DC | PRN

## 2014-12-24 NOTE — Patient Instructions (Addendum)
I will see you in September for your physical. Come in a week before for labs. The only lab I want you to think about is PSA. Read over information I gave. If you opt out of it, just let the lab know.   Happy to refill meds as needed for 1 year from this visit.   We will check on cialis samples 20mg -take 1/2 to 1 tablet as needed before sex.

## 2014-12-24 NOTE — Assessment & Plan Note (Signed)
Controlled. Continue celexa 20mg . High risk patient with history of alcohol and cocaine abuse. Patient aware of addictive personality and its risks.

## 2014-12-24 NOTE — Progress Notes (Signed)
  Tana ConchStephen Boots Mcglown, MD Phone: [phone]  Subjective:   Terry Mcpherson is a 54 y.o. year old very pleasant male patient who presents with the following:  Patient is here to follow up on depression, GERD, and erectile dysfunction. Depression controlled on celexa for many years. Sande BrothersWinters seem to be worse for patient and admits to some anhedonia mild at times. For his GERD it is controlled on omeprazole. Finally, erectile dysfunction is controlled on 10mg  of cialis.   ROS- No SI/HI. No alcohol use in >5 years. No chest pain or shortness of breath. With cialis use, no chest pain, shortness of breath or erections >4 hours.   Past Medical History- Patient Active Problem List   Diagnosis Date Noted  . History of alcohol abuse 12/24/2014    Priority: Medium  . Depression 10/07/2007    Priority: Medium  . Erectile dysfunction 12/24/2014    Priority: Low  . Allergic rhinitis 10/22/2010    Priority: Low  . GERD 10/07/2007    Priority: Low   Medications- reviewed and updated Current Outpatient Prescriptions  Medication Sig Dispense Refill  . citalopram (CELEXA) 20 MG tablet TAKE ONE TABLET BY MOUTH ONE TIME DAILY 90 tablet 2  . ibuprofen (ADVIL,MOTRIN) 200 MG tablet Take 200 mg by mouth as needed.    Marland Kitchen. omeprazole (PRILOSEC) 20 MG capsule Take 20 mg by mouth daily.    . tadalafil (CIALIS) 20 MG tablet Take 0.5-1 tablets (10-20 mg total) by mouth every other day as needed for erectile dysfunction. 3 tablet 3   Objective: BP 120/70 mmHg  Temp(Src) 99.1 F (37.3 C)  Wt 224 lb (101.606 kg) Gen: NAD, resting comfortably CV: RRR no murmurs rubs or gallops Lungs: CTAB no crackles, wheeze, rhonchi Abdomen: soft/nontender/nondistended/normal bowel sounds.  Ext: no edema Skin: warm, dry, no rash  Neuro: grossly normal, moves all extremities, normal gait   Assessment/Plan:  Depression Controlled. Continue celexa 20mg . High risk patient with history of alcohol and cocaine abuse. Patient aware  of addictive personality and its risks.    GERD Controlled on omeprazole 20mg . continue   Erectile dysfunction Controlled on cialis 5-10mg  primarily. Samples given for 5mg  today can use 1-2 before sex.     Return precautions advised. Follow up for physical/establish visit next visit.   Meds ordered this encounter  Medications  . tadalafil (CIALIS) 20 MG tablet    Sig: Take 0.5-1 tablets (10-20 mg total) by mouth every other day as needed for erectile dysfunction.    Dispense:  3 tablet    Refill:  11

## 2014-12-24 NOTE — Assessment & Plan Note (Signed)
Controlled on cialis 5-10mg  primarily. Samples given for 5mg  today can use 1-2 before sex.

## 2014-12-24 NOTE — Assessment & Plan Note (Signed)
Controlled on omeprazole 20mg . continue

## 2014-12-27 ENCOUNTER — Ambulatory Visit: Payer: Self-pay | Admitting: Psychology

## 2015-01-14 ENCOUNTER — Other Ambulatory Visit: Payer: Self-pay

## 2015-01-14 DIAGNOSIS — F329 Major depressive disorder, single episode, unspecified: Secondary | ICD-10-CM

## 2015-01-14 DIAGNOSIS — F32A Depression, unspecified: Secondary | ICD-10-CM

## 2015-01-14 MED ORDER — CITALOPRAM HYDROBROMIDE 20 MG PO TABS: ORAL_TABLET | ORAL | Status: DC

## 2015-01-24 ENCOUNTER — Ambulatory Visit (INDEPENDENT_AMBULATORY_CARE_PROVIDER_SITE_OTHER): Payer: 59 | Admitting: Psychology

## 2015-01-24 DIAGNOSIS — F341 Dysthymic disorder: Secondary | ICD-10-CM

## 2015-03-21 ENCOUNTER — Ambulatory Visit: Payer: 59 | Admitting: Psychology

## 2015-03-29 ENCOUNTER — Ambulatory Visit (INDEPENDENT_AMBULATORY_CARE_PROVIDER_SITE_OTHER): Payer: 59 | Admitting: Psychology

## 2015-03-29 DIAGNOSIS — F341 Dysthymic disorder: Secondary | ICD-10-CM

## 2015-04-12 ENCOUNTER — Ambulatory Visit (INDEPENDENT_AMBULATORY_CARE_PROVIDER_SITE_OTHER): Payer: 59 | Admitting: Psychology

## 2015-04-12 DIAGNOSIS — F341 Dysthymic disorder: Secondary | ICD-10-CM

## 2015-04-17 ENCOUNTER — Ambulatory Visit: Payer: 59 | Admitting: Psychology

## 2015-04-29 ENCOUNTER — Ambulatory Visit: Payer: 59 | Admitting: Psychology

## 2015-06-10 ENCOUNTER — Ambulatory Visit: Payer: Self-pay | Admitting: Family Medicine

## 2015-08-01 ENCOUNTER — Ambulatory Visit (INDEPENDENT_AMBULATORY_CARE_PROVIDER_SITE_OTHER): Payer: 59 | Admitting: Family Medicine

## 2015-08-01 ENCOUNTER — Encounter: Payer: Self-pay | Admitting: Family Medicine

## 2015-08-01 VITALS — BP 140/90 | HR 74 | Temp 98.5°F | Wt 223.0 lb

## 2015-08-01 DIAGNOSIS — M7582 Other shoulder lesions, left shoulder: Secondary | ICD-10-CM | POA: Diagnosis not present

## 2015-08-01 DIAGNOSIS — F329 Major depressive disorder, single episode, unspecified: Secondary | ICD-10-CM | POA: Diagnosis not present

## 2015-08-01 DIAGNOSIS — F32A Depression, unspecified: Secondary | ICD-10-CM

## 2015-08-01 MED ORDER — SERTRALINE HCL 100 MG PO TABS: 100.0000 mg | ORAL_TABLET | Freq: Every day | ORAL | Status: DC

## 2015-08-01 NOTE — Patient Instructions (Signed)
Change from Celexa to zoloft. Max dose of zoloft is , we are starting at 100, may move to 150 at follow up.   Ice for 3 days for 15-20 minutes 2-3x a day, then heat Aleve twice a day for 7 days Start exercises after 3 days  See me in 6 weeks or sooner if you need [place]

## 2015-08-01 NOTE — Progress Notes (Signed)
Terry Conch, MD Phone: [phone]  Subjective:  Patient presents today to establish care with me as their new primary care provider. Patient was formerly a patient of Dr. Lovell Sheehan and Ebony Cargo. Chief complaint-noted.   See problem oriented charting-remains abstinent ROS- no chest pain or shortness of breath. No SI/HI.   The following were reviewed and entered/updated in epic: Past Medical History  Diagnosis Date  . Depression   . GERD (gastroesophageal reflux disease)   . Allergy   . Chronic sinusitis   . Anxiety    Patient Active Problem List   Diagnosis Date Noted  . History of alcohol abuse 12/24/2014    Priority: Medium  . Depression 10/07/2007    Priority: Medium  . Erectile dysfunction 12/24/2014    Priority: Low  . Allergic rhinitis 10/22/2010    Priority: Low  . GERD 10/07/2007    Priority: Low   Past Surgical History  Procedure Laterality Date  . Egd with dilatation  2001  . Colonoscopy      Family History  Problem Relation Age of Onset  . Colon cancer Neg Hx   . Deep vein thrombosis Father     PE after fracture  . Depression Father   . Depression Mother     sister, brother    Medications- reviewed and updated Current Outpatient Prescriptions  Medication Sig Dispense Refill  . citalopram (CELEXA) 20 MG tablet TAKE ONE TABLET BY MOUTH ONE TIME DAILY 90 tablet 2  . ibuprofen (ADVIL,MOTRIN) 200 MG tablet Take 200 mg by mouth as needed.    Marland Kitchen omeprazole (PRILOSEC) 20 MG capsule Take 20 mg by mouth daily.    . tadalafil (CIALIS) 20 MG tablet Take 0.5-1 tablets (10-20 mg total) by mouth every other day as needed for erectile dysfunction. 3 tablet 11   No current facility-administered medications for this visit.    Allergies-reviewed and updated Allergies  Allergen Reactions  . Penicillins     hives    Social History   Social History  . Marital Status: Divorced    Spouse Name: N/A  . Number of Children: N/A  . Years of Education: N/A    Social History Main Topics  . Smoking status: Never Smoker   . Smokeless tobacco: Never Used  . Alcohol Use: No  . Drug Use: No  . Sexual Activity: Not Asked   Other Topics Concern  . None   Social History Narrative   Married. 3 children, 2 step kids. No grandkids.    Kids at [facility] and [place]      Works in Airline pilot- [employer]      Hobbies: workout regularly, theatre, church involvement    ROS--See HPI   Objective: BP 140/90 mmHg  Pulse 74  Temp(Src) 98.5 F (36.9 C)  Wt 223 lb (101.152 kg) Gen: NAD, resting comfortably HEENT: Mucous membranes are moist. Oropharynx normal Neck: no thyromegaly CV: RRR no murmurs rubs or gallops Lungs: CTAB no crackles, wheeze, rhonchi Abdomen: soft/nontender/nondistended/normal bowel sounds. No rebound or guarding.  Ext: no edema Skin: warm, dry Neuro: grossly normal, moves all extremities, PERRLA, good strength in hand  Shoulder: Inspection reveals no abnormalities, atrophy or asymmetry. Palpation is normal with no tenderness over AC joint or bicipital groove. ROM is full in all planes. Rotator cuff strength normal throughout. signs of impingement with positive  Hawkin's tests, empty can. No pain with Neer.  Good push off of back. No issues with resisted IR and ER of shoulder.  Mild painful arc but no drop arm sign.  Assessment/Plan:  Depression S: mild poorly controlled. On citalopram well over a decade. prozac in 30s. Had been on lithium with a divorce and with leading up to sobriety. Also on seroquel- though never diagnosed with bipolar. Currently,  2-3 days every 2 months will feel very depressed though denies SI/HI. Not circumstantial. Most of the time does well. Has seen a therapist in the past, dealing with some kid related issues. Considering psychiatry A/P:Issues getting into psych as needs referral but psych offices generally do not accept from this office. I am going to enter a psych referral which  office he establishes with can use if needed. [facility] does not appear to accept his insurance. In meantime, we are going to trial switch from celexa to zoloft. Planned bumping celexa dose but interferes with qt interval along with omeprazole. Start at 100mg , consider 150mg  as next step at 6 week follow up. Patient aware of suicide precautions on SSRI.    L shoulder pain S: concerned about rotator cuff. 9 months of issues. Worse when he works out, better with breaks. Overhead activity causes mild to moderate pain. Not worsening but has not been able to improve it. Has pain in AM when wakes up especially if he sleeps on arm A/P:  L rotator cuff tendonitis. Trial ice and 1 week aleve. Start exercises after 2-3 days. Stop upper body workouts except for handout given. Follow up in 6 weeks. Could consider steroid injection vs. Sports med referral Return precautions advised.   Watch BP- nsaid related? Previously controlled We will consider maxing out zoloft before trial of wellbutrin added on. OR may get to psych before that time.   BP Readings from Last 3 Encounters:  08/01/15 140/90  12/24/14 120/70  09/25/14 133/85   Orders Placed This Encounter  Procedures  . Ambulatory referral to Psychiatry    Referral Priority:  Routine    Referral Type:  Psychiatric    Referral Reason:  Specialty Services Required    Requested Specialty:  Psychiatry    Number of Visits Requested:  1    Meds ordered this encounter  Medications  . sertraline (ZOLOFT) 100 MG tablet    Sig: Take 1 tablet (100 mg total) by mouth daily.    Dispense:  30 tablet    Refill:  5   >50% of 25 minute office visit was spent on counseling (depression concerns and discussions about treatment options) and coordination of care

## 2015-08-01 NOTE — Assessment & Plan Note (Signed)
S: mild poorly controlled. On citalopram well over a decade. prozac in 30s. Had been on lithium with a divorce and with leading up to sobriety. Also on seroquel- though never diagnosed with bipolar. Currently,  2-3 days every 2 months will feel very depressed though denies SI/HI. Not circumstantial. Most of the time does well. Has seen a therapist in the past, dealing with some kid related issues. Considering psychiatry A/P:Issues getting into psych as needs referral but psych offices generally do not accept from this office. I am going to enter a psych referral which office he establishes with can use if needed. [facility] does not appear to accept his insurance. In meantime, we are going to trial switch from celexa to zoloft. Planned bumping celexa dose but interferes with qt interval along with omeprazole. Start at , consider  as next step at 6 week follow up. Patient aware of suicide precautions on SSRI.

## 2015-08-19 ENCOUNTER — Ambulatory Visit: Payer: Self-pay | Admitting: Family Medicine

## 2015-09-12 ENCOUNTER — Ambulatory Visit (INDEPENDENT_AMBULATORY_CARE_PROVIDER_SITE_OTHER): Payer: 59 | Admitting: Family Medicine

## 2015-09-12 ENCOUNTER — Encounter: Payer: Self-pay | Admitting: Family Medicine

## 2015-09-12 VITALS — BP 130/80 | HR 78 | Temp 98.6°F | Wt 216.0 lb

## 2015-09-12 DIAGNOSIS — F329 Major depressive disorder, single episode, unspecified: Secondary | ICD-10-CM | POA: Diagnosis not present

## 2015-09-12 DIAGNOSIS — M7582 Other shoulder lesions, left shoulder: Secondary | ICD-10-CM | POA: Diagnosis not present

## 2015-09-12 DIAGNOSIS — F32A Depression, unspecified: Secondary | ICD-10-CM

## 2015-09-12 MED ORDER — SERTRALINE HCL 100 MG PO TABS: 100.0000 mg | ORAL_TABLET | Freq: Every day | ORAL | Status: DC

## 2015-09-12 NOTE — Patient Instructions (Addendum)
Depression  Appears to have stablized  We still need more time to really see if change has helped  If you have recurrent 2-3 day "down" episodes  Call me and will bump to 150mg  then monitor over 3 months  If recurs again, could go to 200mg   If still having issues at that point would want to see you in person and discuss the wellbutrin as potential next step  Maintain shoulder strength by doing exercises at least once a week now May start lifting but much lighter weight and see how you do, take a step back if needed. Just in case- continue your icing regimen after working out  Come back for next available physical . Schedule lab visit as well Ask lab when you come in a week before to add Hepatitis C under exposure to viral illness

## 2015-09-12 NOTE — Progress Notes (Signed)
Tana ConchStephen Hunter, MD  Subjective:  Terry MattesScott N Mcpherson is a 54 y.o. year old very pleasant male patient who presents for/with See problem oriented charting ROS- no SI or HI. No hand or arm weakness, numbness or tingling  Past Medical History-  Patient Active Problem List   Diagnosis Date Noted  . History of alcohol abuse 12/24/2014    Priority: Medium  . Depression 10/07/2007    Priority: Medium  . Erectile dysfunction 12/24/2014    Priority: Low  . Allergic rhinitis 10/22/2010    Priority: Low  . GERD 10/07/2007    Priority: Low    Medications- reviewed and updated Current Outpatient Prescriptions  Medication Sig Dispense Refill  . omeprazole (PRILOSEC) 20 MG capsule Take 20 mg by mouth daily.    . sertraline (ZOLOFT) 100 MG tablet Take 1 tablet (100 mg total) by mouth daily. 90 tablet 3  . tadalafil (CIALIS) 20 MG tablet Take 0.5-1 tablets (10-20 mg total) by mouth every other day as needed for erectile dysfunction. 3 tablet 11  . ibuprofen (ADVIL,MOTRIN) 200 MG tablet Take 200 mg by mouth as needed.     No current facility-administered medications for this visit.    Objective: BP 130/80 mmHg  Pulse 78  Temp(Src) 98.6 F (37 C)  Wt 216 lb (97.977 kg) Gen: NAD, resting comfortably CV: RRR no murmurs rubs or gallops Lungs: CTAB no crackles, wheeze, rhonchi Abdomen: soft/nontender/nondistended/normal bowel sounds. No rebound or guarding.  Ext: no edema Skin: warm, dry Neuro: grossly normal, moves all extremities  Assessment/Plan:  Depression S: with change from celexa to zoloft Felt flat for 2 weeks. Some anxiety at first on zoloft. That resolved. Sex drive has normalized after dipping down at first. Patient Held off on psychiatry visit. Has not had enough time yet to see if he will have 2-3 day down episodes that prompted change in medication. Overall feeling well currently though A/P: patient to reach out if he has another down period and we will consider titration to  150mg  at first and eventually 200mg  of zoloft before adding wellbutrin. He can also see psych at any point in process if he would like.   L rotator cuff tendonitis S: shoulder pain improved with NSAID course, icing, and doing exercises. He has held off on weight lifting completely though and would liek to restart. Previous 9 months of issues.  O: mild pain with hawkings, minimal with push off. Empty can and neer negative. Painful arc now gone.  A/P: maintain with once a week rotator cuff exercises. Can go back to lifting but light weights when involving that arm/shoulder and will ice afterwords.   Patient also did 21 day sugar detox- down 9 lbs. He is thrilled with results. Continue to check in on healthy lifestyle habits.   Agrees to with next labs- asked patient to ask lab to add on or I can add on at that time.  Health Maintenance Due  Topic Date Due  . Hepatitis C Screening  [birth year]   Within 6 months CPE   Meds ordered this encounter  Medications  . sertraline (ZOLOFT) 100 MG tablet    Sig: Take 1 tablet (100 mg total) by mouth daily.    Dispense:  90 tablet    Refill:  3

## 2015-09-12 NOTE — Assessment & Plan Note (Signed)
S: with change from celexa to zoloft Felt flat for 2 weeks. Some anxiety at first on zoloft. That resolved. Sex drive has normalized after dipping down at first. Patient Held off on psychiatry visit. Has not had enough time yet to see if he will have 2-3 day down episodes that prompted change in medication. Overall feeling well currently though A/P: patient to reach out if he has another down period and we will consider titration to 150mg  at first and eventually 200mg  of zoloft before adding wellbutrin. He can also see psych at any point in process if he would like.

## 2015-10-31 ENCOUNTER — Other Ambulatory Visit (INDEPENDENT_AMBULATORY_CARE_PROVIDER_SITE_OTHER): Payer: 59

## 2015-10-31 DIAGNOSIS — Z0000 Encounter for general adult medical examination without abnormal findings: Secondary | ICD-10-CM

## 2015-10-31 LAB — LIPID PANEL
CHOLESTEROL: 192 mg/dL (ref 0–200)
HDL: 30.3 mg/dL — ABNORMAL LOW (ref >39.00)
LDL CALC: 134 mg/dL — AB (ref 0–99)
NonHDL: 162.08
TRIGLYCERIDES: 139 mg/dL (ref 0.0–149.0)
Total CHOL/HDL Ratio: 6
VLDL: 27.8 mg/dL (ref 0.0–40.0)

## 2015-10-31 LAB — POCT URINALYSIS DIPSTICK
BILIRUBIN UA: NEGATIVE
Blood, UA: NEGATIVE
GLUCOSE UA: NEGATIVE
Ketones, UA: NEGATIVE
LEUKOCYTES UA: NEGATIVE
NITRITE UA: NEGATIVE
PH UA: 5.5
Spec Grav, UA: >=1.03
Urobilinogen, UA: 0.2

## 2015-10-31 LAB — HEPATIC FUNCTION PANEL
ALT: 24 U/L (ref 0–53)
AST: 19 U/L (ref 0–37)
Albumin: 4.1 g/dL (ref 3.5–5.2)
Alkaline Phosphatase: 64 U/L (ref 39–117)
BILIRUBIN TOTAL: 0.5 mg/dL (ref 0.2–1.2)
Bilirubin, Direct: 0.1 mg/dL (ref 0.0–0.3)
Total Protein: 6.8 g/dL (ref 6.0–8.3)

## 2015-10-31 LAB — BASIC METABOLIC PANEL
BUN: 15 mg/dL (ref 6–23)
CO2: 27 meq/L (ref 19–32)
Calcium: 9.2 mg/dL (ref 8.4–10.5)
Chloride: 104 mEq/L (ref 96–112)
Creatinine, Ser: 0.84 mg/dL (ref 0.40–1.50)
GFR: 101.05 mL/min (ref >60.00)
GLUCOSE: 101 mg/dL — AB (ref 70–99)
POTASSIUM: 4.2 meq/L (ref 3.5–5.1)
SODIUM: 140 meq/L (ref 135–145)

## 2015-10-31 LAB — CBC WITH DIFFERENTIAL/PLATELET
Basophils Absolute: 0.1 10*3/uL (ref 0.0–0.1)
Basophils Relative: 0.8 % (ref 0.0–3.0)
EOS PCT: 4.3 % (ref 0.0–5.0)
Eosinophils Absolute: 0.3 10*3/uL (ref 0.0–0.7)
HCT: 47.1 % (ref 39.0–52.0)
Hemoglobin: 15.8 g/dL (ref 13.0–17.0)
LYMPHS ABS: 2 10*3/uL (ref 0.7–4.0)
Lymphocytes Relative: 28.9 % (ref 12.0–46.0)
MCHC: 33.5 g/dL (ref 30.0–36.0)
MCV: 92.6 fl (ref 78.0–100.0)
MONOS PCT: 9.7 % (ref 3.0–12.0)
Monocytes Absolute: 0.7 10*3/uL (ref 0.1–1.0)
NEUTROS ABS: 4 10*3/uL (ref 1.4–7.7)
NEUTROS PCT: 56.3 % (ref 43.0–77.0)
Platelets: 334 10*3/uL (ref 150.0–400.0)
RBC: 5.09 Mil/uL (ref 4.22–5.81)
RDW: 12.7 % (ref 11.5–15.5)
WBC: 7.1 10*3/uL (ref 4.0–10.5)

## 2015-10-31 LAB — PSA: PSA: 1.64 ng/mL (ref 0.10–4.00)

## 2015-10-31 LAB — TSH: TSH: 1.5 u[IU]/mL (ref 0.35–4.50)

## 2015-11-04 ENCOUNTER — Ambulatory Visit (INDEPENDENT_AMBULATORY_CARE_PROVIDER_SITE_OTHER): Payer: 59 | Admitting: Family Medicine

## 2015-11-04 ENCOUNTER — Encounter: Payer: Self-pay | Admitting: Family Medicine

## 2015-11-04 VITALS — BP 126/80 | HR 67 | Temp 98.6°F | Wt 218.0 lb

## 2015-11-04 DIAGNOSIS — K219 Gastro-esophageal reflux disease without esophagitis: Secondary | ICD-10-CM

## 2015-11-04 DIAGNOSIS — E785 Hyperlipidemia, unspecified: Secondary | ICD-10-CM | POA: Insufficient documentation

## 2015-11-04 DIAGNOSIS — Z0000 Encounter for general adult medical examination without abnormal findings: Secondary | ICD-10-CM | POA: Diagnosis not present

## 2015-11-04 DIAGNOSIS — F329 Major depressive disorder, single episode, unspecified: Secondary | ICD-10-CM

## 2015-11-04 DIAGNOSIS — F32A Depression, unspecified: Secondary | ICD-10-CM

## 2015-11-04 NOTE — Progress Notes (Signed)
[name], MD Phone: [phone]  Subjective:  Patient presents today for their annual physical. Chief complaint-noted.   See problem oriented charting- ROS- full  review of systems was completed and negative except for: No SI/HI. No chest pain or shortness of breath. No headache or blurry vision.   The following were reviewed and entered/updated in epic: Past Medical History  Diagnosis Date  . Depression   . GERD (gastroesophageal reflux disease)   . Allergy   . Chronic sinusitis   . Anxiety    Patient Active Problem List   Diagnosis Date Noted  . History of alcohol abuse 12/24/2014    Priority: Medium  . Depression 10/07/2007    Priority: Medium  . Erectile dysfunction 12/24/2014    Priority: Low  . Allergic rhinitis 10/22/2010    Priority: Low  . GERD 10/07/2007    Priority: Low  . Hyperlipidemia 11/04/2015   Past Surgical History  Procedure Laterality Date  . Egd with dilatation  2001  . Colonoscopy      Family History  Problem Relation Age of Onset  . Colon cancer Neg Hx   . Deep vein thrombosis Father     PE after fracture  . Depression Father   . Depression Mother     sister, brother    Medications- reviewed and updated Current Outpatient Prescriptions  Medication Sig Dispense Refill  . omeprazole (PRILOSEC) 20 MG capsule Take 20 mg by mouth daily.    . sertraline (ZOLOFT) 100 MG tablet Take 1 tablet (100 mg total) by mouth daily. 90 tablet 3  . ibuprofen (ADVIL,MOTRIN) 200 MG tablet Take 200 mg by mouth as needed.    . tadalafil (CIALIS) 20 MG tablet Take 0.5-1 tablets (10-20 mg total) by mouth every other day as needed for erectile dysfunction. (Patient not taking: Reported on 11/04/2015) 3 tablet 11   No current facility-administered medications for this visit.    Allergies-reviewed and updated Allergies  Allergen Reactions  . Penicillins     hives    Social History   Social History  . Marital Status: Divorced    Spouse Name: N/A   . Number of Children: N/A  . Years of Education: N/A   Social History Main Topics  . Smoking status: Never Smoker   . Smokeless tobacco: Never Used  . Alcohol Use: No  . Drug Use: No  . Sexual Activity: Not Asked   Other Topics Concern  . None   Social History Narrative   Married. 3 children, 2 step kids. No grandkids.    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 Oropharynx normal Neck: no thyromegaly CV: RRR no murmurs rubs or gallops Lungs: CTAB no crackles, wheeze, rhonchi Abdomen: soft/nontender/nondistended/normal bowel sounds. No rebound or guarding.  Rectal: normal tone, normal prostate, no masses or tenderness Ext: no edema Skin: warm, dry Neuro: grossly normal, moves all extremities, PERRLA  Assessment/Plan:  54 y.o. male presenting for annual physical.  Health Maintenance counseling: 1. Anticipatory guidance: Patient counseled regarding regular dental exams, eye exams, wearing seatbelts.  2. Risk factor reduction:  Advised patient of need for regular exercise and diet rich and fruits and vegetables to reduce  risk of heart attack and stroke. 3x a week at gym then walks.  3. Immunizations/screenings/ancillary studies- declines flu, otherwise up to date on immunizations Health Maintenance Due  Topic Date Due  . Hepatitis C Screening - next bloodwork 10/23/1961   4. Prostate cancer screening- low risk based off rectal and PSA   Lab Results  Component Value Date   PSA 1.64 10/31/2015   PSA 1.43 05/31/2014   PSA 1.14 01/09/2013   5. Colon cancer screening - repeat 09/25/2024  Remains abstinent from alcohol since 2002  Depression controlled with transition to zoloft 100mg  with phq9 of  0  GERD Discussed trial of zantac/ranitidine  Hyperlipidemia 7.5% risk 2016. We discussed 10-12% cut off before recommending statin or aspirin. Has lost 7 lbs. Discussed losing another 5-10 lbs.   hyperglycemia- improved fasting from 107 to 101  1 year CPE Return precautions advised.

## 2015-11-04 NOTE — Assessment & Plan Note (Signed)
controlled with transition to zoloft 100mg  with phq9 of 0

## 2015-11-04 NOTE — Assessment & Plan Note (Signed)
Discussed trial of zantac/ranitidine

## 2015-11-04 NOTE — Patient Instructions (Addendum)
See you in a year for a physical  Great job losing 7 lbs since last year. Another 7-10 lbs down would be perfect. Risk for diabetes is lower than last year  Glad zoloft is working well for you. Please let me know if that changes  Trial ranitidine twice a day instead of prilosec to see how you do  Health Maintenance Due  Topic Date Due  . Hepatitis C Screening - next labs [birth year]

## 2015-11-04 NOTE — Assessment & Plan Note (Addendum)
7.5% risk 2016. We discussed 10-12% cut off before recommending statin or aspirin. Has lost 7 lbs. Discussed losing another 5-10 lbs.

## 2015-11-08 ENCOUNTER — Other Ambulatory Visit: Payer: Self-pay | Admitting: Family Medicine

## 2015-12-30 ENCOUNTER — Telehealth: Payer: Self-pay | Admitting: Family Medicine

## 2015-12-30 NOTE — Telephone Encounter (Signed)
See below

## 2015-12-30 NOTE — Telephone Encounter (Signed)
Notification received from [facility] that prior authorization for CIALIS 20 MG tablet has been denied.  Cialis can be approved if all of the following apply:  1. Your patient has an organic cause of erectile dysfunction(i.e. Diabetes mellitus, hypertension, atherosclerosis, drug induced, hypercholesterolemia, renal insufficiency, etc.) 2.  Your patient is not concurrently receiving an alternative phosphodiesterase -5 enzyme inhibitor( e.g., Cialis, Levitra, Staxyn, Stendra or Viagra.    The information provided does not show that your patient meets these criteria.

## 2015-12-31 NOTE — Telephone Encounter (Signed)
Patient came in today wanting to know if we had any samples of the CIALIS 20 MG tablet.

## 2015-12-31 NOTE — Telephone Encounter (Signed)
Patient insurance information has also been updated.

## 2016-01-02 NOTE — Telephone Encounter (Signed)
Likely drug induced due to zoloft.

## 2016-01-02 NOTE — Telephone Encounter (Signed)
Left a voicemail for the patient to call back.

## 2016-01-02 NOTE — Telephone Encounter (Signed)
Once box of samples of Cialis 20 mg available to give to patient. Lot # [id number], exp date 02/2018.  Erica please call patient and inform him he can pick this box of samples.  Also, I will resubmit his prior authorization with the new insurance information provided for dx of drug induced erectile dysfunction from zoloft as noted.

## 2016-01-03 NOTE — Telephone Encounter (Signed)
Patient called back and was made aware that a box of samples was available and that we will resubmit his prior authorization.

## 2016-01-07 NOTE — Telephone Encounter (Signed)
Fax received from [employer] stating prior authorization for CIALIS 20 MG tablet has been approved until 01/02/2017.  Attempted to reach patient via phone, however no answer.

## 2016-02-27 ENCOUNTER — Ambulatory Visit (INDEPENDENT_AMBULATORY_CARE_PROVIDER_SITE_OTHER): Payer: 59 | Admitting: Family Medicine

## 2016-02-27 ENCOUNTER — Encounter: Payer: Self-pay | Admitting: Family Medicine

## 2016-02-27 VITALS — BP 132/80 | HR 91 | Temp 98.3°F | Wt 220.0 lb

## 2016-02-27 DIAGNOSIS — B9689 Other specified bacterial agents as the cause of diseases classified elsewhere: Secondary | ICD-10-CM

## 2016-02-27 DIAGNOSIS — J329 Chronic sinusitis, unspecified: Secondary | ICD-10-CM

## 2016-02-27 DIAGNOSIS — A499 Bacterial infection, unspecified: Secondary | ICD-10-CM | POA: Diagnosis not present

## 2016-02-27 MED ORDER — AZITHROMYCIN 250 MG PO TABS: ORAL_TABLET | ORAL | Status: DC

## 2016-02-27 NOTE — Progress Notes (Signed)
PCP: Tana ConchStephen Yoav Okane, MD  Subjective:  Terry Mcpherson is a 55 y.o. year old very pleasant male patient who presents with sinusitis symptoms including nasal congestion with green drainage, sinus tenderness, cough, chest congestion -day of illness:14 -started: 2 weeks ago -Symptoms are worsening. Cough and chest congestion have improved. But some pressure in sinuses and green drainage from nares persists and in fact has worsened over last 3 days.  -previous treatments: rest, hydration, tylenol -sick contacts/travel/risks: denies flu exposure.  -Hx of: allergies yes  ROS-denies fever, SOB, NVD, tooth pain  Pertinent Past Medical History-  Patient Active Problem List   Diagnosis Date Noted  . History of alcohol abuse 12/24/2014    Priority: Medium  . Depression 10/07/2007    Priority: Medium  . Erectile dysfunction 12/24/2014    Priority: Low  . Allergic rhinitis 10/22/2010    Priority: Low  . GERD 10/07/2007    Priority: Low  . Hyperlipidemia 11/04/2015    Medications- reviewed  Current Outpatient Prescriptions  Medication Sig Dispense Refill  . azithromycin (ZITHROMAX) 250 MG tablet Take 2 tabs on day 1, then 1 tab daily until finished 6 tablet 0  . CIALIS 20 MG tablet TAKE 1/2 TO 1 TABLET BY MOUTH EVERY OTHER DAY AS NEEDED FOR ERECTILE DYSFUNCTION 3 tablet 3  . ibuprofen (ADVIL,MOTRIN) 200 MG tablet Take 200 mg by mouth as needed.    Marland Kitchen. omeprazole (PRILOSEC) 20 MG capsule Take 20 mg by mouth daily.    . sertraline (ZOLOFT) 100 MG tablet Take 1 tablet (100 mg total) by mouth daily. 90 tablet 3   No current facility-administered medications for this visit.    Objective: BP 132/80 mmHg  Pulse 91  Temp(Src) 98.3 F (36.8 C)  Wt 220 lb (99.791 kg) Gen: NAD, resting comfortably HEENT: Turbinates erythematous with green drainage, TM normal, pharynx mildly erythematous with no tonsilar exudate or edema, maxillary sinus tenderness CV: RRR no murmurs rubs or gallops Lungs: CTAB  no crackles, wheeze, rhonchi Abdomen: soft/nontender/nondistended/normal bowel sounds. No rebound or guarding.  Ext: no edema Skin: warm, dry, no rash Neuro: grossly normal, moves all extremities  Assessment/Plan:  Sinsusitis Bacterial based on: Symptoms >10 days, double sickening  Treatment: - symptomatic care with mucinex -Antibiotic indicated: yes. Prefer augmentin but Penicillin allergic. Given his overall health- likely azithromycin will give him enough benefit to recover from this. Doxycycline could be used- photosensitivity listed at 1% along with azithromycin.   Finally, we reviewed reasons to return to care including if symptoms worsen or persist or new concerns arise.  Meds ordered this encounter  Medications  . azithromycin (ZITHROMAX) 250 MG tablet    Sig: Take 2 tabs on day 1, then 1 tab daily until finished    Dispense:  6 tablet    Refill:  0  good overall health- low risk patient

## 2016-02-27 NOTE — Patient Instructions (Signed)
Sinsusitis Bacterial based on: Symptoms >10 days, double sickening  Treatment: - symptomatic care with mucinex -Antibiotic indicated: yes. Prefer augmentin but Penicillin allergic. Prefer doxycycline but [place] beach. Given his overall health- likely azithromycin will give him enough benefit to recover from this.   Finally, we reviewed reasons to return to care including if symptoms worsen or persist or new concerns arise.  Meds ordered this encounter  Medications  . azithromycin (ZITHROMAX) 250 MG tablet    Sig: Take 2 tabs on day 1, then 1 tab daily until finished    Dispense:  6 tablet    Refill:  0  At [facility]

## 2016-06-29 ENCOUNTER — Other Ambulatory Visit: Payer: Self-pay | Admitting: Family Medicine

## 2016-10-10 ENCOUNTER — Other Ambulatory Visit: Payer: Self-pay | Admitting: Family Medicine

## 2016-10-29 ENCOUNTER — Other Ambulatory Visit (INDEPENDENT_AMBULATORY_CARE_PROVIDER_SITE_OTHER): Payer: 59

## 2016-10-29 DIAGNOSIS — R7989 Other specified abnormal findings of blood chemistry: Secondary | ICD-10-CM

## 2016-10-29 DIAGNOSIS — Z0000 Encounter for general adult medical examination without abnormal findings: Secondary | ICD-10-CM | POA: Diagnosis not present

## 2016-10-29 LAB — CBC WITH DIFFERENTIAL/PLATELET
Basophils Absolute: 0.1 10*3/uL (ref 0.0–0.1)
Basophils Relative: 0.6 % (ref 0.0–3.0)
EOS PCT: 3 % (ref 0.0–5.0)
Eosinophils Absolute: 0.3 10*3/uL (ref 0.0–0.7)
HEMATOCRIT: 46 % (ref 39.0–52.0)
HEMOGLOBIN: 15.7 g/dL (ref 13.0–17.0)
LYMPHS PCT: 22.3 % (ref 12.0–46.0)
Lymphs Abs: 2 10*3/uL (ref 0.7–4.0)
MCHC: 34.2 g/dL (ref 30.0–36.0)
MCV: 90.8 fl (ref 78.0–100.0)
MONO ABS: 0.9 10*3/uL (ref 0.1–1.0)
MONOS PCT: 10.2 % (ref 3.0–12.0)
Neutro Abs: 5.6 10*3/uL (ref 1.4–7.7)
Neutrophils Relative %: 63.9 % (ref 43.0–77.0)
Platelets: 324 10*3/uL (ref 150.0–400.0)
RBC: 5.06 Mil/uL (ref 4.22–5.81)
RDW: 12.7 % (ref 11.5–15.5)
WBC: 8.8 10*3/uL (ref 4.0–10.5)

## 2016-10-29 LAB — HEPATIC FUNCTION PANEL
ALT: 40 U/L (ref 0–53)
AST: 25 U/L (ref 0–37)
Albumin: 4.2 g/dL (ref 3.5–5.2)
Alkaline Phosphatase: 67 U/L (ref 39–117)
BILIRUBIN TOTAL: 0.5 mg/dL (ref 0.2–1.2)
Bilirubin, Direct: 0.1 mg/dL (ref 0.0–0.3)
Total Protein: 7 g/dL (ref 6.0–8.3)

## 2016-10-29 LAB — POC URINALSYSI DIPSTICK (AUTOMATED)
Bilirubin, UA: NEGATIVE
Blood, UA: NEGATIVE
GLUCOSE UA: NEGATIVE
KETONES UA: NEGATIVE
LEUKOCYTES UA: NEGATIVE
Nitrite, UA: NEGATIVE
Protein, UA: NEGATIVE
SPEC GRAV UA: 1.02
UROBILINOGEN UA: 0.2
pH, UA: 5.5

## 2016-10-29 LAB — BASIC METABOLIC PANEL
BUN: 15 mg/dL (ref 6–23)
CO2: 31 mEq/L (ref 19–32)
Calcium: 9.7 mg/dL (ref 8.4–10.5)
Chloride: 106 mEq/L (ref 96–112)
Creatinine, Ser: 0.95 mg/dL (ref 0.40–1.50)
GFR: 87.35 mL/min (ref >60.00)
Glucose, Bld: 117 mg/dL — ABNORMAL HIGH (ref 70–99)
POTASSIUM: 4.8 meq/L (ref 3.5–5.1)
SODIUM: 142 meq/L (ref 135–145)

## 2016-10-29 LAB — LIPID PANEL
CHOL/HDL RATIO: 6
Cholesterol: 200 mg/dL (ref 0–200)
HDL: 31.9 mg/dL — AB (ref >39.00)
NonHDL: 167.95
TRIGLYCERIDES: 212 mg/dL — AB (ref 0.0–149.0)
VLDL: 42.4 mg/dL — ABNORMAL HIGH (ref 0.0–40.0)

## 2016-10-29 LAB — TSH: TSH: 1.16 u[IU]/mL (ref 0.35–4.50)

## 2016-10-29 LAB — PSA: PSA: 1.44 ng/mL (ref 0.10–4.00)

## 2016-10-29 LAB — LDL CHOLESTEROL, DIRECT: LDL DIRECT: 125 mg/dL

## 2016-11-04 ENCOUNTER — Encounter: Payer: Self-pay | Admitting: Family Medicine

## 2016-11-04 ENCOUNTER — Ambulatory Visit (INDEPENDENT_AMBULATORY_CARE_PROVIDER_SITE_OTHER): Payer: 59 | Admitting: Family Medicine

## 2016-11-04 ENCOUNTER — Other Ambulatory Visit: Payer: Self-pay | Admitting: Family Medicine

## 2016-11-04 VITALS — BP 138/84 | HR 80 | Temp 98.7°F | Ht 75.25 in | Wt 224.4 lb

## 2016-11-04 DIAGNOSIS — Z0000 Encounter for general adult medical examination without abnormal findings: Secondary | ICD-10-CM | POA: Diagnosis not present

## 2016-11-04 DIAGNOSIS — Z23 Encounter for immunization: Secondary | ICD-10-CM | POA: Diagnosis not present

## 2016-11-04 DIAGNOSIS — R739 Hyperglycemia, unspecified: Secondary | ICD-10-CM | POA: Diagnosis not present

## 2016-11-04 DIAGNOSIS — E785 Hyperlipidemia, unspecified: Secondary | ICD-10-CM | POA: Diagnosis not present

## 2016-11-04 NOTE — Progress Notes (Signed)
Pre visit review using our clinic review tool, if applicable. No additional management support is needed unless otherwise documented below in the visit note. 

## 2016-11-04 NOTE — Progress Notes (Signed)
Phone: [phone]  Subjective:  Patient presents today for their annual physical. Chief complaint-noted.   See problem oriented charting- ROS- full  review of systems was completed and negative except for: some erythema at crease of buttocks persistent for years not worsening. Recent cough, congestion improving greatly  The following were reviewed and entered/updated in epic: Past Medical History:  Diagnosis Date  . Allergy   . Anxiety   . Chronic sinusitis   . Depression   . GERD (gastroesophageal reflux disease)    Patient Active Problem List   Diagnosis Date Noted  . Hyperlipidemia 11/04/2015    Priority: Medium  . History of alcohol abuse 12/24/2014    Priority: Medium  . Depression 10/07/2007    Priority: Medium  . Erectile dysfunction 12/24/2014    Priority: Low  . Allergic rhinitis 10/22/2010    Priority: Low  . GERD 10/07/2007    Priority: Low  . Hyperglycemia 11/04/2016   Past Surgical History:  Procedure Laterality Date  . COLONOSCOPY    . egd with dilatation  2001    Family History  Problem Relation Age of Onset  . Colon cancer Neg Hx   . Deep vein thrombosis Father     PE after fracture  . Depression Father   . Depression Mother     sister, brother    Medications- reviewed and updated Current Outpatient Prescriptions  Medication Sig Dispense Refill  . CIALIS 20 MG tablet TAKE 1/2 TO 1 TABLET BY MOUTH EVERY OTHER DAY AS NEEDED FOR ERECTILE DYSFUNCTION 3 tablet 3  . ibuprofen (ADVIL,MOTRIN) 200 MG tablet Take 200 mg by mouth as needed.    Marland Kitchen. omeprazole (PRILOSEC) 20 MG capsule Take 20 mg by mouth daily.    . sertraline (ZOLOFT) 100 MG tablet TAKE 1 TABLET BY MOUTH DAILY. 90 tablet 2   No current facility-administered medications for this visit.     Allergies-reviewed and updated Allergies  Allergen Reactions  . Penicillins     hives    Social History   Social History  . Marital status: Divorced    Spouse name: N/A  . Number of  children: N/A  . Years of education: N/A   Social History Main Topics  . Smoking status: Never Smoker  . Smokeless tobacco: Never Used  . Alcohol use No  . Drug use: No  . Sexual activity: Not Asked   Other Topics Concern  . None   Social History Narrative   Married. 3 children, 2 step kids. No grandkids.    [facility] [place]      Works in [employer]- [employer]      Hobbies: workout regularly, theatre, church involvement    Objective: BP 138/84 (BP Location: Left Arm, Patient Position: Sitting, Cuff Size: Large)   Pulse 80   Temp 98.7 F (37.1 C) (Oral)   Ht 6' 3.25" (1.911 m)   Wt 224 lb 6.4 oz (101.8 kg)   SpO2 94%   BMI 27.86 kg/m  Gen: NAD, resting comfortably HEENT: Mucous membranes are moist. Oropharynx normal Neck: no thyromegaly CV: RRR no murmurs rubs or gallops Lungs: CTAB no crackles, wheeze, rhonchi Abdomen: soft/nontender/nondistended/normal bowel sounds. No rebound or guarding.  Ext: no edema Skin: warm, dry, mild erythema in patch about 8 x 8 cm near crease at top of buttocks Neuro: grossly normal, moves all extremities, PERRLA Rectal: normal tone, diffusely enlarged prostate, no masses or tenderness  Assessment/Plan:  55 y.o. male presenting for annual  physical.  Health Maintenance counseling: 1. Anticipatory guidance: Patient counseled regarding regular dental exams, eye exams (no issues), wearing seatbelts.  2. Risk factor reduction:  Advised patient of need for regular exercise and diet rich and fruits and vegetables to reduce risk of heart attack and stroke.  3. Immunizations/screenings/ancillary studies- received flu shot. Consider Hep C testing in future Immunization History  Administered Date(s) Administered  . Influenza Whole 10/07/2007  . Td 06/30/2010  4. Prostate cancer screening- low risk based off PSA trend and rectal . BPH on exam- nocturia twice a night and some dribbling Lab Results  Component Value Date    PSA 1.44 10/29/2016   PSA 1.64 10/31/2015   PSA 1.43 05/31/2014   5. Colon cancer screening - 09/25/14 with 10 year repeat  6. Skin cancer prevention-  Advised regular sunscreen use. May see [facility]  Status of chronic or acute concerns  Continues to go to AA and abstinent  Depression- on zoloft 100mg - winter season tougher for him but doing well at present  ED- on cialis doing well  Allergic rhinitis- nasocort helps  GERD_ on prilosec 20mg   Watch BP- up with weight  Hyperglycemia Hyperglycemia worsening with CBG up to 117. Extensive lifestyle counseling   Hyperlipidemia Hyperlipidemia- 10 year risk with initial BP 9.6%. Weight trending up 6 lbs over last year. Declines statin- to work on lifestyle   Return in about 6 months (around 05/05/2017) for weight, blood pressure check, consider prick test for a1c.  Return precautions advised.   Tana ConchStephen Hunter, MD

## 2016-11-04 NOTE — Patient Instructions (Signed)
With weight up- blood pressure, cholesterol and blood sugar up  I would recommend setting goal of 150 minutes exercise per week  Target 3-5 veggie servings a day  Focus on water only to drink  Let me know if that spot on tongue does not get better- may also get your dentist to check it- wonder if related to virus you are getting over

## 2016-11-04 NOTE — Addendum Note (Signed)
Addended by: Rivka SaferSOUTHERN HIZER, Asher MuirJAMIE M on: 11/04/2016 10:09 AM   Modules accepted: Orders

## 2016-11-04 NOTE — Assessment & Plan Note (Signed)
Hyperglycemia worsening with CBG up to 117. Extensive lifestyle counseling

## 2016-11-04 NOTE — Assessment & Plan Note (Signed)
Hyperlipidemia- 10 year risk with initial BP 9.6%. Weight trending up 6 lbs over last year. Declines statin- to work on lifestyle

## 2017-03-04 ENCOUNTER — Telehealth: Payer: Self-pay | Admitting: Family Medicine

## 2017-03-04 ENCOUNTER — Telehealth: Payer: Self-pay

## 2017-03-04 MED ORDER — TADALAFIL 20 MG PO TABS: ORAL_TABLET | ORAL | 3 refills | Status: DC

## 2017-03-04 NOTE — Telephone Encounter (Signed)
° ° ° °  Pt request refill of the following:     Phamacy:     [facility]

## 2017-03-04 NOTE — Telephone Encounter (Signed)
° ° °  Pt call to say he would like the  generic for Cialis  SILDENAFIL     [facility]

## 2017-03-04 NOTE — Telephone Encounter (Signed)
Received PA request from [facility] for Sildenafil. PA submitted & is pending. Key: [id number]

## 2017-03-04 NOTE — Telephone Encounter (Signed)
Rx faxed to pharmacy  

## 2017-03-04 NOTE — Telephone Encounter (Signed)
Rx printed awaiting to be signed.  

## 2017-03-08 NOTE — Telephone Encounter (Signed)
PA denied.  Sildenafil can be approved if: (1) If your patient's diagnosis is consistent with an indication listed in the product's FDA-approved prescribing information. AND The additional requirements listed in the Indications and Usage and Dosage and Administration sections of the prescribing information (or package insert) have been met (eg: first line therapies have been tried & failed, any testing requirements have been met) (2) your patient's diagnosis meets the off-label administrative guideline criteria.

## 2017-03-10 NOTE — Telephone Encounter (Signed)
Please inform him. Can't see prior phone notes at present to see why switched off of cialis

## 2017-03-11 NOTE — Telephone Encounter (Signed)
Called and left a voicemail message asking for a return phone call 

## 2017-03-16 MED ORDER — SILDENAFIL CITRATE 20 MG PO TABS: 20.0000 mg | ORAL_TABLET | Freq: Three times a day (TID) | ORAL | 0 refills | Status: DC

## 2017-03-16 NOTE — Telephone Encounter (Signed)
Pt states he would like to have the rx only, and will pay cash and do outside of insurance.  Pt would like to pick up the rx Sildenafil. And take to a pharmacy of his choice

## 2017-03-16 NOTE — Telephone Encounter (Signed)
Rx printed for Dr. Durene Cal to sign. Pt advised. Rx placed at front desk for pick up. Nothing further needed at this time.

## 2017-03-16 NOTE — Addendum Note (Signed)
Addended by: Marvene Staff H on: 03/16/2017 10:59 AM   Modules accepted: Orders

## 2017-03-18 ENCOUNTER — Telehealth: Payer: Self-pay | Admitting: Family Medicine

## 2017-03-18 ENCOUNTER — Other Ambulatory Visit: Payer: Self-pay

## 2017-03-18 MED ORDER — SILDENAFIL CITRATE 20 MG PO TABS: 20.0000 mg | ORAL_TABLET | Freq: Three times a day (TID) | ORAL | 0 refills | Status: DC

## 2017-03-18 NOTE — Telephone Encounter (Signed)
Called and left a voicemail  Message asking for a return phone call 

## 2017-03-18 NOTE — Telephone Encounter (Signed)
Prescription sent to pharmacy as requested.

## 2017-03-18 NOTE — Telephone Encounter (Signed)
° °  Pt call to ask why he was only given 10 tablets and why no refills . He said Karin Golden has a  Coupon and he can get 90 tablets at a good price. He said he only takes as needed and 90 tablets would probably last him a year and he does not go through his insurance because it is not covered. Would like RX called in to below pharmacy     Pharmacy [facility]

## 2017-05-05 ENCOUNTER — Encounter: Payer: Self-pay | Admitting: Family Medicine

## 2017-05-05 ENCOUNTER — Ambulatory Visit (INDEPENDENT_AMBULATORY_CARE_PROVIDER_SITE_OTHER): Payer: 59 | Admitting: Family Medicine

## 2017-05-05 VITALS — BP 120/68 | HR 72 | Temp 97.8°F | Ht 75.25 in | Wt 213.8 lb

## 2017-05-05 DIAGNOSIS — R739 Hyperglycemia, unspecified: Secondary | ICD-10-CM | POA: Diagnosis not present

## 2017-05-05 DIAGNOSIS — H9191 Unspecified hearing loss, right ear: Secondary | ICD-10-CM

## 2017-05-05 DIAGNOSIS — F325 Major depressive disorder, single episode, in full remission: Secondary | ICD-10-CM

## 2017-05-05 LAB — POCT GLYCOSYLATED HEMOGLOBIN (HGB A1C): HEMOGLOBIN A1C: 5.4

## 2017-05-05 NOTE — Assessment & Plan Note (Signed)
S: Patient had a cold around October or November and felt some fullness and hearing loss in the right ear. The cold eventually resolved the patient states he has had continued hearing loss on the right side. It only improved slightly. A/P: New hearing loss unilateral. We will refer to ear nose and throat for further evaluation

## 2017-05-05 NOTE — Assessment & Plan Note (Signed)
S: He denies anhedonia or depressed mood. No SI. He is compliant with Zoloft and feels this is working well for him A/P: Continue current medication

## 2017-05-05 NOTE — Patient Instructions (Signed)
We will call you within a week or two about your referral to ENT. If you do not hear within 3 weeks, give us a call.   GREAT job on weigh tloss Wt Readings from Last 3 Encounters:  05/05/17 213 lb 12.8 oz (97 kg)  11/04/16 224 lb 6.4 oz (101.8 kg)  02/27/16 220 lb (99.8 kg)   At risk for diabetes range for a1c is from 5.7-6.4. With your weight loss you have gotten this down to 5.4 and not in at risk range! Fantastic work. Keep it up  BP Readings from Last 3 Encounters:  05/05/17 120/68  11/04/16 138/84  blood pressure also responded

## 2017-05-05 NOTE — Progress Notes (Signed)
Subjective:  Terry Mcpherson is a 56 y.o. year old very pleasant male patient who presents for/with See problem oriented charting ROS- No chest pain or shortness of breath. No headache or blurry vision.    Past Medical History-  Patient Active Problem List   Diagnosis Date Noted  . Hearing loss of right ear 05/05/2017    Priority: High  . Hyperglycemia 11/04/2016    Priority: Medium  . Hyperlipidemia 11/04/2015    Priority: Medium  . History of alcohol abuse 12/24/2014    Priority: Medium  . Depression 10/07/2007    Priority: Medium  . Erectile dysfunction 12/24/2014    Priority: Low  . Allergic rhinitis 10/22/2010    Priority: Low  . GERD 10/07/2007    Priority: Low    Medications- reviewed and updated Current Outpatient Prescriptions  Medication Sig Dispense Refill  . ibuprofen (ADVIL,MOTRIN) 200 MG tablet Take 200 mg by mouth as needed.    Marland Kitchen. omeprazole (PRILOSEC) 20 MG capsule Take 20 mg by mouth daily.    . sertraline (ZOLOFT) 100 MG tablet TAKE 1 TABLET BY MOUTH DAILY. 90 tablet 2  . sildenafil (REVATIO) 20 MG tablet Take 1 tablet (20 mg total) by mouth 3 (three) times daily. 90 tablet 0   No current facility-administered medications for this visit.     Objective: BP 120/68 (BP Location: Right Arm, Patient Position: Sitting, Cuff Size: Large)   Pulse 72   Temp 97.8 F (36.6 C) (Oral)   Ht 6' 3.25" (1.911 m)   Wt 213 lb 12.8 oz (97 kg)   SpO2 98%   BMI 26.55 kg/m  Gen: NAD, resting comfortably Tympanic membrane normal bilaterally CV: RRR no murmurs rubs or gallops Lungs: CTAB no crackles, wheeze, rhonchi Abdomen: soft/nontender/nondistended/normal bowel sounds. Thinner than last visit Ext: no edema Skin: warm, dry   Assessment/Plan:  Depression S: He denies anhedonia or depressed mood. No SI. He is compliant with Zoloft and feels this is working well for him A/P: Continue current medication  Hyperglycemia S: Patient has worked hard on weight loss and  has lost 11 pounds. He is exercising 4-5 days a week. 2 of these days he is playing tennis for over 2 hours. He also improved his diet and cut down on his candy intake. Prior blood sugar fasting last year was 117, he comes in for point-of-care A1c A/P: Lab Results  Component Value Date   HGBA1C 5.4 05/05/2017  No increased risk of diabetes based off of A1c today. Continue weight loss efforts and recheck likely CBG and A1c at physical in 6 months   Hearing loss of right ear S: Patient had a cold around October or November and felt some fullness and hearing loss in the right ear. The cold eventually resolved the patient states he has had continued hearing loss on the right side. It only improved slightly. A/P: New hearing loss unilateral. We will refer to ear nose and throat for further evaluation  Blood pressure looks much better with weight loss as well Return in about 6 months (around 11/04/2017) for physical.  Orders Placed This Encounter  Procedures  . Ambulatory referral to ENT    Referral Priority:   Routine    Referral Type:   Consultation    Referral Reason:   Specialty Services Required    Requested Specialty:   Otolaryngology    Number of Visits Requested:   1  . POCT glycosylated hemoglobin (Hb A1C)   Return precautions advised.  Jeannett SeniorStephen  Yong Channel, MD

## 2017-05-05 NOTE — Assessment & Plan Note (Addendum)
S: Patient has worked hard on weight loss and has lost 11 pounds. He is exercising 4-5 days a week. 2 of these days he is playing tennis for over 2 hours. He also improved his diet and cut down on his candy intake. Prior blood sugar fasting last year was 117, he comes in for point-of-care A1c A/P: Lab Results  Component Value Date   HGBA1C 5.4 05/05/2017  No increased risk of diabetes based off of A1c today. Continue weight loss efforts and recheck likely CBG and A1c at physical in 6 months

## 2017-05-12 DIAGNOSIS — J310 Chronic rhinitis: Secondary | ICD-10-CM | POA: Diagnosis not present

## 2017-05-12 DIAGNOSIS — H9041 Sensorineural hearing loss, unilateral, right ear, with unrestricted hearing on the contralateral side: Secondary | ICD-10-CM | POA: Diagnosis not present

## 2017-07-14 ENCOUNTER — Other Ambulatory Visit: Payer: Self-pay | Admitting: Family Medicine

## 2017-07-20 DIAGNOSIS — Z23 Encounter for immunization: Secondary | ICD-10-CM | POA: Diagnosis not present

## 2017-10-13 ENCOUNTER — Other Ambulatory Visit: Payer: Self-pay | Admitting: Family Medicine

## 2017-11-05 ENCOUNTER — Ambulatory Visit (INDEPENDENT_AMBULATORY_CARE_PROVIDER_SITE_OTHER): Payer: 59 | Admitting: Family Medicine

## 2017-11-05 ENCOUNTER — Encounter: Payer: Self-pay | Admitting: Family Medicine

## 2017-11-05 VITALS — BP 124/82 | HR 92 | Temp 98.6°F | Ht 75.0 in | Wt 214.0 lb

## 2017-11-05 DIAGNOSIS — E785 Hyperlipidemia, unspecified: Secondary | ICD-10-CM

## 2017-11-05 DIAGNOSIS — R739 Hyperglycemia, unspecified: Secondary | ICD-10-CM

## 2017-11-05 DIAGNOSIS — Z87898 Personal history of other specified conditions: Secondary | ICD-10-CM | POA: Diagnosis not present

## 2017-11-05 DIAGNOSIS — R351 Nocturia: Secondary | ICD-10-CM

## 2017-11-05 DIAGNOSIS — N401 Enlarged prostate with lower urinary tract symptoms: Secondary | ICD-10-CM | POA: Diagnosis not present

## 2017-11-05 DIAGNOSIS — F1011 Alcohol abuse, in remission: Secondary | ICD-10-CM

## 2017-11-05 DIAGNOSIS — Z0000 Encounter for general adult medical examination without abnormal findings: Secondary | ICD-10-CM | POA: Diagnosis not present

## 2017-11-05 DIAGNOSIS — Z1159 Encounter for screening for other viral diseases: Secondary | ICD-10-CM

## 2017-11-05 DIAGNOSIS — K219 Gastro-esophageal reflux disease without esophagitis: Secondary | ICD-10-CM

## 2017-11-05 DIAGNOSIS — H9191 Unspecified hearing loss, right ear: Secondary | ICD-10-CM

## 2017-11-05 DIAGNOSIS — F325 Major depressive disorder, single episode, in full remission: Secondary | ICD-10-CM | POA: Diagnosis not present

## 2017-11-05 LAB — LIPID PANEL
CHOLESTEROL: 207 mg/dL — AB (ref 0–200)
HDL: 37.3 mg/dL — ABNORMAL LOW (ref >39.00)
LDL Cholesterol: 137 mg/dL — ABNORMAL HIGH (ref 0–99)
NonHDL: 169.39
Total CHOL/HDL Ratio: 6
Triglycerides: 161 mg/dL — ABNORMAL HIGH (ref 0.0–149.0)
VLDL: 32.2 mg/dL (ref 0.0–40.0)

## 2017-11-05 LAB — CBC
HEMATOCRIT: 47.3 % (ref 39.0–52.0)
Hemoglobin: 15.9 g/dL (ref 13.0–17.0)
MCHC: 33.6 g/dL (ref 30.0–36.0)
MCV: 92.7 fl (ref 78.0–100.0)
PLATELETS: 327 10*3/uL (ref 150.0–400.0)
RBC: 5.1 Mil/uL (ref 4.22–5.81)
RDW: 12.6 % (ref 11.5–15.5)
WBC: 7 10*3/uL (ref 4.0–10.5)

## 2017-11-05 LAB — COMPREHENSIVE METABOLIC PANEL
ALBUMIN: 4.2 g/dL (ref 3.5–5.2)
ALK PHOS: 63 U/L (ref 39–117)
ALT: 31 U/L (ref 0–53)
AST: 24 U/L (ref 0–37)
BUN: 15 mg/dL (ref 6–23)
CO2: 31 mEq/L (ref 19–32)
CREATININE: 0.91 mg/dL (ref 0.40–1.50)
Calcium: 9.6 mg/dL (ref 8.4–10.5)
Chloride: 104 mEq/L (ref 96–112)
GFR: 91.46 mL/min (ref >60.00)
Glucose, Bld: 96 mg/dL (ref 70–99)
POTASSIUM: 4.6 meq/L (ref 3.5–5.1)
SODIUM: 141 meq/L (ref 135–145)
TOTAL PROTEIN: 7.2 g/dL (ref 6.0–8.3)
Total Bilirubin: 0.6 mg/dL (ref 0.2–1.2)

## 2017-11-05 LAB — PSA: PSA: 1.19 ng/mL (ref 0.10–4.00)

## 2017-11-05 NOTE — Assessment & Plan Note (Signed)
Hyperlipidemia- was to work on lifestyle. Had lost 13 lbs as of June and now gained 1- largely stable. Calculate 10 year risk after lipids

## 2017-11-05 NOTE — Assessment & Plan Note (Signed)
Hyperglycemia- update CBG  given cbg 117 last year. a1c in June luckily only 5.4. Had been losing weight

## 2017-11-05 NOTE — Progress Notes (Addendum)
Phone: [phone]  Subjective:  Patient presents today for their annual physical. Chief complaint-noted.   See problem oriented charting- ROS- full  review of systems was completed and negative except for: mild right sided hearing loss  The following were reviewed and entered/updated in epic: Past Medical History:  Diagnosis Date  . Allergy   . Anxiety   . Chronic sinusitis   . Depression   . GERD (gastroesophageal reflux disease)    Patient Active Problem List   Diagnosis Date Noted  . Hearing loss of right ear 05/05/2017    Priority: High  . Hyperglycemia 11/04/2016    Priority: Medium  . Hyperlipidemia 11/04/2015    Priority: Medium  . History of alcohol abuse 12/24/2014    Priority: Medium  . Depression 10/07/2007    Priority: Medium  . Erectile dysfunction 12/24/2014    Priority: Low  . Allergic rhinitis 10/22/2010    Priority: Low  . GERD 10/07/2007    Priority: Low   Past Surgical History:  Procedure Laterality Date  . COLONOSCOPY    . egd with dilatation  2001    Family History  Problem Relation Age of Onset  . Colon cancer Neg Hx   . Deep vein thrombosis Father        PE after fracture  . Depression Father   . Depression Mother        sister, brother    Medications- reviewed and updated Current Outpatient Medications  Medication Sig Dispense Refill  . ibuprofen (ADVIL,MOTRIN) 200 MG tablet Take 200 mg by mouth as needed.    Marland Kitchen. omeprazole (PRILOSEC) 20 MG capsule Take 20 mg by mouth daily.    . sertraline (ZOLOFT) 100 MG tablet TAKE 1 TABLET BY MOUTH DAILY. 90 tablet 2  . sildenafil (REVATIO) 20 MG tablet TAKE ONE TABLET BY MOUTH THREE TIMES A DAY 90 tablet 1   No current facility-administered medications for this visit.     Allergies-reviewed and updated Allergies  Allergen Reactions  . Penicillins     hives    Social History   Socioeconomic History  . Marital status: Divorced    Spouse name: None  . Number of children: None  .  Years of education: None  . Highest education level: None  Social Needs  . Financial resource strain: None  . Food insecurity - worry: None  . Food insecurity - inability: None  . Transportation needs - medical: None  . Transportation needs - non-medical: None  Occupational History  . None  Tobacco Use  . Smoking status: Never Smoker  . Smokeless tobacco: Never Used  Substance and Sexual Activity  . Alcohol use: No    Alcohol/week: 0.0 oz  . Drug use: No  . Sexual activity: None  Other Topics Concern  . None  Social History Narrative   Married. 3 children, 2 step kids. No grandkids.    [facility] [facility] [place]      Works in [employer]- [employer]      Hobbies: workout regularly, theatre, church involvement    Objective: BP 124/82 (BP Location: Left Arm, Patient Position: Sitting, Cuff Size: Large)   Pulse 92   Temp 98.6 F (37 C) (Oral)   Ht 6\' 3"  (1.905 m)   Wt 214 lb (97.1 kg)   SpO2 92%   BMI 26.75 kg/m  Gen: NAD, resting comfortably HEENT: Mucous membranes are moist. Oropharynx normal Neck: no thyromegaly CV: RRR no murmurs rubs or gallops Lungs: CTAB  no crackles, wheeze, rhonchi Abdomen: soft/nontender/nondistended/normal bowel sounds. No rebound or guarding.  Ext: no edema Skin: warm, dry Neuro: grossly normal, moves all extremities, PERRLA Rectal: normal tone, diffusely enlarged prostate, no masses or tenderness  Assessment/Plan:  56 y.o. male presenting for annual physical.  Health Maintenance counseling: 1. Anticipatory guidance: Patient counseled regarding regular dental exams -q6 months, eye exams - saw eye doctor within a few weeks- getting new glasses, wearing seatbelts.  2. Risk factor reduction:  Advised patient of need for regular exercise and diet rich and fruits and vegetables to reduce risk of heart attack and stroke. Exercise- at least 3 days a week at [facility]. Diet-diet has loosened in last month- last week was bad with  snow.  Wt Readings from Last 3 Encounters:  11/05/17 214 lb (97.1 kg)  05/05/17 213 lb 12.8 oz (97 kg)  11/04/16 224 lb 6.4 oz (101.8 kg)  3. Immunizations/screenings/ancillary studies - HCV screen today . Discussed shingrix availability issues Immunization History  Administered Date(s) Administered  . Influenza Whole 10/07/2007  . Influenza,inj,Quad PF,6+ Mos 11/04/2016  . Influenza-Unspecified 07/20/2017  . Td 06/30/2010  4. Prostate cancer screening- low risk PSA trend in past- update today. BPH on exam with nocturia twice a night and some dribbling- stable from last year   Lab Results  Component Value Date   PSA 1.44 10/29/2016   PSA 1.64 10/31/2015   PSA 1.43 05/31/2014   5. Colon cancer screening - 09/25/14 with 10 year repeat 6. Skin cancer screening- no dermatologist last year- was considering [place] [facility] - did not go. advised regular sunscreen use. Denies worrisome, changing, or new skin lesions.   Status of chronic or acute concerns   Cialis helping with ED  We were watching BP as well- came down with weight loss BP Readings from Last 3 Encounters:  11/05/17 124/82  05/05/17 120/68  11/04/16 138/84   GERD Prilosec helping GERD- also advised trial of zantac to see if effective  History of alcohol abuse Remains abstinent from alcohol with regular AA meetings since 2002  Depression Depression- remains on zoloft . Winter tougher for him usually. phq9 is 1 today- doing well  Hyperglycemia Hyperglycemia- update CBG  given cbg 117 last year. a1c in June luckily only 5.4. Had been losing weight  Hyperlipidemia Hyperlipidemia- was to work on lifestyle. Had lost 13 lbs as of June and now gained 1- largely stable. Calculate 10 year risk after lipids  Hearing loss of right ear Saw Dr. Lazarus Salines back in June for hearing loss- mild right sided hearing loss with chronic rhinitis and presumed sinusitis and advised 6-12 month recheck. flonase  helping allergic  rhinitis  Return in about 1 year (around 11/05/2018) for physical. 6 month visit if weight creeping up.  Orders Placed This Encounter  Procedures  . CBC    [place]  . Comprehensive metabolic panel    [place]    Order Specific Question:   Has the patient fasted?    Answer:   No  . Lipid panel    [place]    Order Specific Question:   Has the patient fasted?    Answer:   No  . Hepatitis C antibody  . PSA   Return precautions advised.  [name], MD

## 2017-11-05 NOTE — Assessment & Plan Note (Signed)
Depression- remains on zoloft 100mg . Winter tougher for him usually. phq9 is 1 today- doing well

## 2017-11-05 NOTE — Assessment & Plan Note (Signed)
Prilosec helping GERD- also advised trial of zantac to see if effective

## 2017-11-05 NOTE — Assessment & Plan Note (Signed)
Remains abstinent from alcohol with regular AA meetings since 2002

## 2017-11-05 NOTE — Assessment & Plan Note (Signed)
Saw Dr. Lazarus SalinesWolicki back in June for hearing loss- mild right sided hearing loss with chronic rhinitis and presumed sinusitis and advised 6-12 month recheck. flonase  helping allergic rhinitis

## 2017-11-05 NOTE — Patient Instructions (Signed)
Trial zantac 150mg  twice a day. Possible if you do really well with this you could only take before dinner.

## 2017-11-06 LAB — HEPATITIS C ANTIBODY
Hepatitis C Ab: NONREACTIVE
SIGNAL TO CUT-OFF: 0.02 (ref <1.00)

## 2018-05-18 ENCOUNTER — Other Ambulatory Visit: Payer: Self-pay

## 2018-05-18 MED ORDER — SERTRALINE HCL 100 MG PO TABS: 100.0000 mg | ORAL_TABLET | Freq: Every day | ORAL | 2 refills | Status: DC

## 2018-07-04 ENCOUNTER — Ambulatory Visit (INDEPENDENT_AMBULATORY_CARE_PROVIDER_SITE_OTHER): Payer: 59 | Admitting: Psychology

## 2018-07-04 DIAGNOSIS — F341 Dysthymic disorder: Secondary | ICD-10-CM

## 2018-07-26 ENCOUNTER — Other Ambulatory Visit: Payer: Self-pay | Admitting: Family Medicine

## 2018-07-27 ENCOUNTER — Ambulatory Visit: Payer: 59 | Admitting: Psychology

## 2018-08-23 DIAGNOSIS — Z23 Encounter for immunization: Secondary | ICD-10-CM | POA: Diagnosis not present

## 2018-10-21 ENCOUNTER — Other Ambulatory Visit: Payer: Self-pay | Admitting: Family Medicine

## 2018-12-08 ENCOUNTER — Ambulatory Visit (INDEPENDENT_AMBULATORY_CARE_PROVIDER_SITE_OTHER): Payer: 59 | Admitting: Family Medicine

## 2018-12-08 ENCOUNTER — Encounter: Payer: Self-pay | Admitting: Family Medicine

## 2018-12-08 VITALS — BP 130/76 | HR 71 | Temp 97.7°F | Ht 75.0 in | Wt 215.8 lb

## 2018-12-08 DIAGNOSIS — Z23 Encounter for immunization: Secondary | ICD-10-CM

## 2018-12-08 DIAGNOSIS — R739 Hyperglycemia, unspecified: Secondary | ICD-10-CM

## 2018-12-08 DIAGNOSIS — Z79899 Other long term (current) drug therapy: Secondary | ICD-10-CM

## 2018-12-08 DIAGNOSIS — Z0000 Encounter for general adult medical examination without abnormal findings: Secondary | ICD-10-CM | POA: Diagnosis not present

## 2018-12-08 DIAGNOSIS — E785 Hyperlipidemia, unspecified: Secondary | ICD-10-CM | POA: Diagnosis not present

## 2018-12-08 DIAGNOSIS — Z125 Encounter for screening for malignant neoplasm of prostate: Secondary | ICD-10-CM

## 2018-12-08 LAB — LIPID PANEL
Cholesterol: 210 mg/dL — ABNORMAL HIGH (ref 0–200)
HDL: 35.7 mg/dL — AB (ref >39.00)
NonHDL: 174.05
Total CHOL/HDL Ratio: 6
Triglycerides: 203 mg/dL — ABNORMAL HIGH (ref 0.0–149.0)
VLDL: 40.6 mg/dL — AB (ref 0.0–40.0)

## 2018-12-08 LAB — COMPREHENSIVE METABOLIC PANEL
ALT: 19 U/L (ref 0–53)
AST: 20 U/L (ref 0–37)
Albumin: 4.3 g/dL (ref 3.5–5.2)
Alkaline Phosphatase: 63 U/L (ref 39–117)
BUN: 13 mg/dL (ref 6–23)
CHLORIDE: 103 meq/L (ref 96–112)
CO2: 29 mEq/L (ref 19–32)
Calcium: 9.8 mg/dL (ref 8.4–10.5)
Creatinine, Ser: 0.88 mg/dL (ref 0.40–1.50)
GFR: 89.1 mL/min (ref >60.00)
Glucose, Bld: 92 mg/dL (ref 70–99)
Potassium: 4.1 mEq/L (ref 3.5–5.1)
Sodium: 138 mEq/L (ref 135–145)
Total Bilirubin: 0.5 mg/dL (ref 0.2–1.2)
Total Protein: 7.2 g/dL (ref 6.0–8.3)

## 2018-12-08 LAB — CBC
HCT: 45.2 % (ref 39.0–52.0)
Hemoglobin: 15.7 g/dL (ref 13.0–17.0)
MCHC: 34.6 g/dL (ref 30.0–36.0)
MCV: 90.6 fl (ref 78.0–100.0)
Platelets: 330 10*3/uL (ref 150.0–400.0)
RBC: 4.99 Mil/uL (ref 4.22–5.81)
RDW: 12.5 % (ref 11.5–15.5)
WBC: 8.6 10*3/uL (ref 4.0–10.5)

## 2018-12-08 LAB — HEMOGLOBIN A1C: Hgb A1c MFr Bld: 5.6 % (ref 4.6–6.5)

## 2018-12-08 LAB — LDL CHOLESTEROL, DIRECT: Direct LDL: 149 mg/dL

## 2018-12-08 NOTE — Progress Notes (Signed)
Phone: [phone]  Subjective:  Patient presents today for their annual physical. Chief complaint-noted.   See problem oriented charting- ROS- full  review of systems was completed and negative except for some hearing loss in right ear  The following were reviewed and entered/updated in epic: Past Medical History:  Diagnosis Date  . Allergy   . Anxiety   . Chronic sinusitis   . Depression   . GERD (gastroesophageal reflux disease)    Patient Active Problem List   Diagnosis Date Noted  . BPH associated with nocturia 11/05/2017    Priority: Medium  . Hearing loss of right ear 05/05/2017    Priority: Medium  . Hyperglycemia 11/04/2016    Priority: Medium  . Hyperlipidemia 11/04/2015    Priority: Medium  . History of alcohol abuse 12/24/2014    Priority: Medium  . Depression 10/07/2007    Priority: Medium  . Erectile dysfunction 12/24/2014    Priority: Low  . Allergic rhinitis 10/22/2010    Priority: Low  . GERD 10/07/2007    Priority: Low   Past Surgical History:  Procedure Laterality Date  . COLONOSCOPY    . egd with dilatation  2001    Family History  Problem Relation Age of Onset  . Colon cancer Neg Hx   . Deep vein thrombosis Father        PE after fracture  . Depression Father   . Depression Mother        sister, brother    Medications- reviewed and updated Current Outpatient Medications  Medication Sig Dispense Refill  . ibuprofen (ADVIL,MOTRIN) 200 MG tablet Take 200 mg by mouth as needed.    Marland Kitchen. omeprazole (PRILOSEC) 20 MG capsule Take 20 mg by mouth daily.    . sertraline (ZOLOFT) 100 MG tablet TAKE 1 TABLET BY MOUTH EVERY DAY 90 tablet 2  . sildenafil (REVATIO) 20 MG tablet TAKE ONE TABLET BY MOUTH THREE TIMES A DAY 90 tablet 0   No current facility-administered medications for this visit.     Allergies-reviewed and updated Allergies  Allergen Reactions  . Penicillins     hives    Social History   Social History Narrative   Married.  3 children, 2 step kids. No grandkids.    [facility] and [place]      Works in [employer]- [employer]      Hobbies: workout regularly, theatre, church involvement    Objective: BP 130/76 (BP Location: Left Arm, Patient Position: Sitting, Cuff Size: Large)   Pulse 71   Temp 97.7 F (36.5 C) (Oral)   Ht 6\' 3"  (1.905 m)   Wt 215 lb 12.8 oz (97.9 kg)   SpO2 94%   BMI 26.97 kg/m  Gen: NAD, resting comfortably HEENT: Mucous membranes are moist. Oropharynx normal Neck: no thyromegaly CV: RRR no murmurs rubs or gallops Lungs: CTAB no crackles, wheeze, rhonchi Abdomen: soft/nontender/nondistended/normal bowel sounds. No rebound or guarding.  Ext: no edema Skin: warm, dry Neuro: grossly normal, moves all extremities, PERRLA MSK: Weak hip abductors-particular on right.  Tight over IT band.  Assessment/Plan:  58 y.o. male presenting for annual physical.  Health Maintenance counseling: 1. Anticipatory guidance: Patient counseled regarding regular dental exams -q6 months, eye exams -yearly,  avoiding smoking and second hand smoke , limiting alcohol to 2 beverages per day - no drink in 18 years- still in [place]   2. Risk factor reduction:  Advised patient of need for regular exercise and diet rich and  fruits and vegetables to reduce risk of heart attack and stroke. Exercise-last year was doing 3 days a week at [facility]- he is doing a lot of walking instead but plans to get back in the gym. Diet-weight last year was 214-stable this year- lost weight when had an illness in [place]- then regained over winter. He wants to get focused on weight again- wants to look into pickle ball.  Wt Readings from Last 3 Encounters:  12/08/18 215 lb 12.8 oz (97.9 kg)  11/05/17 214 lb (97.1 kg)  05/05/17 213 lb 12.8 oz (97 kg)  3. Immunizations/screenings/ancillary studies-discussed Shingrix today- applied today  Immunization History  Administered Date(s) Administered  . Influenza Whole  10/07/2007  . Influenza,inj,Quad PF,6+ Mos 11/04/2016  . Influenza-Unspecified 07/20/2017, 08/23/2018  . Td 06/30/2010  4. Prostate cancer screening- -prior PSA trend has been low risk- we will get PSA alone today.  Defer rectal exam unless PSA trend concerning.  Some BPH on prior exams.  Still with nocturia twice a night-stable from last year.  Also with some dribbling  Lab Results  Component Value Date   PSA 1.19 11/05/2017   PSA 1.44 10/29/2016   PSA 1.64 10/31/2015   5. Colon cancer screening -09/25/2014 with 10-year follow-up 6. Skin cancer screening- no dermatologist- considering getting into [place] [facility] still. advised regular sunscreen use. Denies worrisome, changing, or new skin lesions.  7.  Never smoker  Status of chronic or acute concerns   Sildenafil continues to help with erectile dysfunction  GERD- Prilosec is helpful.  Last year advised trial of Zantac but unfortunately that is on recall- didn't work and he had to go back to prilosec  Depression-remains on Zoloft 100 mg.  The winter is generally tougher for him.  PHQ 9 is controlled today at 0. Depression screen [facility] 2/9 12/08/2018  Decreased Interest 0  Down, Depressed, Hopeless 0  PHQ - 2 Score 0  Altered sleeping 0  Tired, decreased energy 0  Change in appetite 0  Feeling bad or failure about yourself  0  Trouble concentrating 0  Moving slowly or fidgety/restless 0  Suicidal thoughts 0  PHQ-9 Score 0  Difficult doing work/chores -   Hyperglycemia- has had elevated CBG in the past but A1c was excellent at 5.4- wants to check a1c again  Hyperlipidemia- has wanted to focus on lifestyle.  Will calculate 10-year ASCVD risk  Hearing loss of right ear-followed with Dr. Lazarus Salines in 2018- hasnt followed up- but no worsening of hearing since that time  Some pain on lateral leg . He has continued to try to walk his dogs a lot. On exam- weak lateral hip abductors, tight over IT band. Gave IT band handout to try.   No  problem-specific Assessment & Plan notes found for this encounter.   No future appointments. No follow-ups on file.  Lab/Order associations: creamer with coffee 8 am. Now 2 pm  No diagnosis found.  No orders of the defined types were placed in this encounter.   Return precautions advised.  [name], MD

## 2018-12-08 NOTE — Patient Instructions (Addendum)
Shingrix #1 today. Repeat injection in 2-5 months. Schedule a nurse visit for the 2nd injection before you leave today (at the check out desk)  [facility] but also love how yo Charlaine Dalton been so active walking. Keep up the great job staying active either way. 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   Please stop by lab before you go

## 2018-12-09 LAB — PSA: PSA: 1.08 ng/mL (ref 0.10–4.00)

## 2018-12-09 LAB — VITAMIN B12: Vitamin B-12: 319 pg/mL (ref 211–911)

## 2018-12-15 NOTE — Progress Notes (Signed)
Letter was mailed for patient to call our office regarding labs.

## 2019-01-06 ENCOUNTER — Other Ambulatory Visit: Payer: Self-pay | Admitting: Family Medicine

## 2019-02-09 ENCOUNTER — Ambulatory Visit (INDEPENDENT_AMBULATORY_CARE_PROVIDER_SITE_OTHER): Payer: 59 | Admitting: Family Medicine

## 2019-02-09 ENCOUNTER — Telehealth: Payer: Self-pay

## 2019-02-09 ENCOUNTER — Other Ambulatory Visit: Payer: Self-pay

## 2019-02-09 ENCOUNTER — Ambulatory Visit: Payer: Self-pay | Admitting: Family Medicine

## 2019-02-09 ENCOUNTER — Encounter: Payer: Self-pay | Admitting: Family Medicine

## 2019-02-09 VITALS — BP 130/80 | HR 81 | Temp 98.6°F | Ht 75.0 in

## 2019-02-09 DIAGNOSIS — R05 Cough: Secondary | ICD-10-CM | POA: Diagnosis not present

## 2019-02-09 DIAGNOSIS — R059 Cough, unspecified: Secondary | ICD-10-CM

## 2019-02-09 DIAGNOSIS — B9689 Other specified bacterial agents as the cause of diseases classified elsewhere: Secondary | ICD-10-CM

## 2019-02-09 DIAGNOSIS — J329 Chronic sinusitis, unspecified: Secondary | ICD-10-CM | POA: Diagnosis not present

## 2019-02-09 LAB — POC INFLUENZA A&B (BINAX/QUICKVUE)
Influenza A, POC: NEGATIVE
Influenza B, POC: NEGATIVE

## 2019-02-09 MED ORDER — DOXYCYCLINE HYCLATE 100 MG PO TABS: 100.0000 mg | ORAL_TABLET | Freq: Two times a day (BID) | ORAL | 0 refills | Status: AC

## 2019-02-09 NOTE — Telephone Encounter (Signed)
Without fever, shortness of breath or known covid-19 context-we opted for in office visit instead

## 2019-02-09 NOTE — Patient Instructions (Addendum)
Sinus infection/Sinusitis Bacterial based on: double sickening (headache and sinus pressure, now worsening over last 2 days with change to green/yellow discharge)  Treatment: -symptomatic care with  Plain mucinex or mucinex- DM (if you want to have something to help with cough as well) Sinus rinses like a Neti Pot or Neilmed sinus rinse (make sure to follow instructions for water preparation Tylenol 650 mg every 6 hours to help with sinus pressure. Theoretically we are trying to avoid ibuprofen right now as may have negative impact on covid 19 -Antibiotic indicated: yes - do not hear an obvious pneumonia but doxycycline would cover for pneumonia in a relatively health year old male in 31s. Penicillin allergic so can't use augmentin.   Finally, we reviewed reasons to return to care including if symptoms worsen or persist  (despite above treatments) or new concerns arise (particularly fever or shortness of breath)  We advised patient that we do not have enough testing supplies to check for covid-19 but out of an abundance of precaution I will have him remain home for the next 7 days and try to isolate from his family if at all possible- if he has significant improvement on the antibiotic that would point heavily towards bacterial cause and not covid-19 and he may return to work.  Would have family member pick up supplies from the store  Meds ordered this encounter  Medications  . doxycycline (VIBRA-TABS) 100 MG tablet    Sig: Take 1 tablet (100 mg total) by mouth 2 (two) times daily for 7 days.    Dispense:  14 tablet    Refill:  0

## 2019-02-09 NOTE — Progress Notes (Signed)
PCP: Shelva Majestic, MD  Subjective:  Terry Mcpherson is a 58 y.o. year old very pleasant male patient who presents with sinusitis symptoms including nasal congestion, sinus tenderness  Patient with symptoms for 6 days Started with head cold and felt like it moved down into throat- had laryngitis type symptoms. Coughing up a lot of dark green mucus. Not short of breath. No wheezing other than when lays down at night.  Has had rhinorrhea and sore throat as well as cough.  Still blowing out green/yellow congestion- started 2 days ago. Doesn't state feel like his typical sinus infections - but does admit coloration has changed.   He denies fever or subjective fevers to me. No chills. No body aches   No shortness of breath No nausea No abdominal pain or diarrhea Never smoker  Travel last week-went to [place] 7th to the 11th. Traveled through [place], [place], [place]. No known direct covid 19 contacts.   -previous treatments: rest/hydration. Meds-has tried Mucinex D-we discussed how this can raise blood pressure but fortunately his blood pressure is controlled today -sick contacts/travel/risks: denies flu exposure. Flu test negative.   ROS-denies fever, SOB, nausea, vomiting, diarrhea, tooth pain  Pertinent Past Medical History-  Patient Active Problem List   Diagnosis Date Noted  . BPH associated with nocturia 11/05/2017    Priority: Medium  . Hearing loss of right ear 05/05/2017    Priority: Medium  . Hyperglycemia 11/04/2016    Priority: Medium  . Hyperlipidemia 11/04/2015    Priority: Medium  . History of alcohol abuse 12/24/2014    Priority: Medium  . Depression 10/07/2007    Priority: Medium  . Erectile dysfunction 12/24/2014    Priority: Low  . Allergic rhinitis 10/22/2010    Priority: Low  . GERD 10/07/2007    Priority: Low    Medications- reviewed  Current Outpatient Medications  Medication Sig Dispense Refill  . ibuprofen  (ADVIL,MOTRIN) 200 MG tablet Take 200 mg by mouth as needed.    Marland Kitchen omeprazole (PRILOSEC) 20 MG capsule Take 20 mg by mouth daily.    . sertraline (ZOLOFT) 100 MG tablet TAKE 1 TABLET BY MOUTH EVERY DAY 90 tablet 2  . sildenafil (REVATIO) 20 MG tablet TAKE ONE TABLET BY MOUTH THREE TIMES A DAY 90 tablet 0   No current facility-administered medications for this visit.     Objective: BP 130/80 (BP Location: Left Arm, Patient Position: Sitting, Cuff Size: Large)   Pulse 81   Temp 98.6 F (37 C) (Oral)   Ht 6\' 3"  (1.905 m)   SpO2 97%   BMI 26.97 kg/m  Gen: NAD, resting comfortably HEENT: Turbinates erythematous with yellow drainage, TM normal, pharynx mildly erythematous with no tonsilar exudate or edema, Minimal sinus tenderness CV: RRR no murmurs rubs or gallops Lungs: CTAB no crackles, wheeze, rhonchi Abdomen: soft/nontender/nondistended/normal bowel sounds. No rebound or guarding.  Ext: no edema Skin: warm, dry, no rash Neuro: grossly normal, moves all extremities  Assessment/Plan:  Sinus infection/Sinusitis Bacterial based on: double sickening (headache and sinus pressure, now worsening over last 2 days with change to green/yellow discharge) -We discussed and current covid-19 environment we are being more aggressive-otherwise would likely give this a few more days to see how it does.  Treatment: -symptomatic care with  Plain mucinex or mucinex- DM (if you want to have something to help with cough as well) Sinus rinses like a Neti Pot or Neilmed sinus rinse (make sure to follow instructions  for water preparation Tylenol 650 mg every 6 hours to help with sinus pressure. Theoretically we are trying to avoid ibuprofen right now as may have negative impact on covid 19 -Antibiotic indicated: yes - do not hear an obvious pneumonia but doxycycline would cover for pneumonia in a relatively health year old male in 46s. Penicillin allergic so can't use augmentin.   Finally, we reviewed  reasons to return to care including if symptoms worsen or persist  (despite above treatments) or new concerns arise (particularly fever or shortness of breath)  We advised patient that we do not have enough testing supplies to check for covid-19 but out of an abundance of precaution I will have him remain home for the next 7 days and try to isolate from his family if at all possible- if he has significant improvement on the antibiotic that would point heavily towards bacterial cause and not covid-19 and he may return to work.  Would have family member pick up supplies from the store  Meds ordered this encounter  Medications  . doxycycline (VIBRA-TABS) 100 MG tablet    Sig: Take 1 tablet (100 mg total) by mouth 2 (two) times daily for 7 days.    Dispense:  14 tablet    Refill:  0  High risk situation during current covid-19 environment requiring higher level decision-making  [name], MD

## 2019-02-09 NOTE — Telephone Encounter (Signed)
Questions for Screening COVID-19  Symptom onset: x 6days  Travel or Contacts: Yes, last week.  During this illness, did/does the patient experience any of the following symptoms? Fever >100.16F []   Yes []   No [x]   Unknown Subjective fever (felt feverish) []   Yes [x]   No []   Unknown Chills []   Yes [x]   No []   Unknown Muscle aches (myalgia) []   Yes [x]   No []   Unknown Runny nose (rhinorrhea) [x]   Yes []   No []   Unknown Sore throat [x]   Yes []   No []   Unknown Cough (new onset or worsening of chronic cough) [x]   Yes []   No []   Unknown Shortness of breath (dyspnea) []   Yes [x]   No []   Unknown Nausea or vomiting []   Yes [x]   No []   Unknown Headache [x]   Yes []   No []   Unknown Abdominal pain  []   Yes [x]   No []   Unknown Diarrhea (?3 loose/looser than normal stools/24hr period) []   Yes [x]   No []   Unknown Other, specify:  Patient risk factors: Smoker? []   Current []   Former [x]   Never If male, currently pregnant? []   Yes [x]   No  Patient Active Problem List   Diagnosis Date Noted  . BPH associated with nocturia 11/05/2017  . Hearing loss of right ear 05/05/2017  . Hyperglycemia 11/04/2016  . Hyperlipidemia 11/04/2015  . Erectile dysfunction 12/24/2014  . History of alcohol abuse 12/24/2014  . Allergic rhinitis 10/22/2010  . Depression 10/07/2007  . GERD 10/07/2007    Plan:  []   High risk for COVID-19 with red flags go to ED (with CP, SOB, weak/lightheaded, or fever > 101.5). Call ahead.  [x]   High risk for COVID-19 but stable will have car visit. Inform provider and coordinate time. Will be completed in afternoon. []   No red flags but URI signs or symptoms will go through side door and be seen in dedicated room.  Note: Referral to telemedicine is an appropriate alternative disposition for higher risk but stable. [facility]/e-Visit: [phone].

## 2019-03-22 ENCOUNTER — Ambulatory Visit (INDEPENDENT_AMBULATORY_CARE_PROVIDER_SITE_OTHER): Payer: 59 | Admitting: Family Medicine

## 2019-03-22 ENCOUNTER — Other Ambulatory Visit: Payer: Self-pay

## 2019-03-22 DIAGNOSIS — Z23 Encounter for immunization: Secondary | ICD-10-CM | POA: Diagnosis not present

## 2019-03-22 NOTE — Progress Notes (Signed)
Los- no charge- injection only visit

## 2019-03-22 NOTE — Patient Instructions (Signed)
There are no preventive care reminders to display for this patient.  Depression screen [facility] 2/9 12/08/2018 11/05/2017  Decreased Interest 0 0  Down, Depressed, Hopeless 0 0  PHQ - 2 Score 0 0  Altered sleeping 0 0  Tired, decreased energy 0 0  Change in appetite 0 1  Feeling bad or failure about yourself  0 0  Trouble concentrating 0 0  Moving slowly or fidgety/restless 0 0  Suicidal thoughts 0 0  PHQ-9 Score 0 1  Difficult doing work/chores - Not difficult at all

## 2019-04-24 ENCOUNTER — Encounter: Payer: Self-pay | Admitting: Family Medicine

## 2019-04-24 ENCOUNTER — Ambulatory Visit (INDEPENDENT_AMBULATORY_CARE_PROVIDER_SITE_OTHER): Payer: 59 | Admitting: Family Medicine

## 2019-04-24 DIAGNOSIS — Z7189 Other specified counseling: Secondary | ICD-10-CM

## 2019-04-24 NOTE — Progress Notes (Signed)
   Chief Complaint:  Terry Mcpherson is a 58 y.o. male who presents today for a virtual office visit with a chief complaint of COVID19 test.   Assessment/Plan:  Concern for COVID-19 Inform patient she has not made current testing criteria for COVID-19 based on our facility's guidelines.  He voiced understanding.  He will try to get tested in [place], [place].  Discussed ways to protect himself and his mother during this time.    Subjective:  HPI:  Patient presents today for COVID-19 testing.  Does not have any current symptoms.  Will be leaving to [facility] tomorrow to take care of his elderly mother and is concerned about potentially exposing her.  Does not live in high risk situation.  No cough, fevers, or chills.  ROS: Per HPI  PMH: He reports that he has never smoked. He has never used smokeless tobacco. He reports that he does not drink alcohol or use drugs.      Objective/Observations  Physical Exam: Gen: NAD, resting comfortably Pulm: Normal work of breathing Neuro: Grossly normal, moves all extremities Psych: Normal affect and thought content  Virtual Visit via Video   I connected with Terry Mcpherson on 04/24/19 at  4:00 PM EDT by a video enabled telemedicine application and verified that I am speaking with the correct person using two identifiers. I discussed the limitations of evaluation and management by telemedicine and the availability of in person appointments. The patient expressed understanding and agreed to proceed.   Patient location: Home Provider location: [place] [facility] [employer] Persons participating in the virtual visit: Myself and Patient     Katina Degree. Jimmey Ralph, MD 04/24/2019 3:57 PM

## 2019-04-25 ENCOUNTER — Ambulatory Visit: Payer: 59 | Admitting: Family Medicine

## 2019-05-18 ENCOUNTER — Other Ambulatory Visit: Payer: Self-pay | Admitting: Family Medicine

## 2019-06-20 ENCOUNTER — Other Ambulatory Visit: Payer: Self-pay

## 2019-06-20 DIAGNOSIS — Z20822 Contact with and (suspected) exposure to covid-19: Secondary | ICD-10-CM

## 2019-06-22 LAB — NOVEL CORONAVIRUS, NAA: SARS-CoV-2, NAA: NOT DETECTED

## 2019-07-20 ENCOUNTER — Other Ambulatory Visit: Payer: Self-pay | Admitting: Family Medicine

## 2019-09-06 NOTE — Progress Notes (Signed)
Phone [phone]   Subjective:  Terry Mcpherson is a 58 y.o. year old very pleasant male patient who presents for/with See problem oriented charting Chief Complaint  Patient presents with  . Follow-up  . medication management- depression   ROS- no fever, chills, nausea, vomiting. Has had some depressed mood. No SI>    Past Medical History-  Patient Active Problem List   Diagnosis Date Noted  . BPH associated with nocturia 11/05/2017    Priority: Medium  . Hearing loss of right ear 05/05/2017    Priority: Medium  . Hyperglycemia 11/04/2016    Priority: Medium  . Hyperlipidemia 11/04/2015    Priority: Medium  . History of alcohol abuse 12/24/2014    Priority: Medium  . Depression 10/07/2007    Priority: Medium  . Erectile dysfunction 12/24/2014    Priority: Low  . Allergic rhinitis 10/22/2010    Priority: Low  . GERD 10/07/2007    Priority: Low    Medications- reviewed and updated Current Outpatient Medications  Medication Sig Dispense Refill  . ibuprofen (ADVIL,MOTRIN) 200 MG tablet Take 200 mg by mouth as needed.    Marland Kitchen omeprazole (PRILOSEC) 20 MG capsule Take 20 mg by mouth daily.    . sertraline (ZOLOFT) 100 MG tablet Take 1.5 tablets (150 mg total) by mouth daily. 135 tablet 3  . sildenafil (REVATIO) 20 MG tablet TAKE ONE TABLET BY MOUTH THREE TIMES A DAY 90 tablet 0   No current facility-administered medications for this visit.      Objective:  BP 110/74   Pulse 76   Temp 97.8 F (36.6 C)   Ht 6\' 3"  (1.905 m)   Wt 217 lb 3.2 oz (98.5 kg)   SpO2 96%   BMI 27.15 kg/m  Gen: NAD, resting comfortably CV: RRR no murmurs rubs or gallops Lungs: CTAB no crackles, wheeze, rhonchi Abdomen: soft/nontender/nondistended/normal bowel sounds. Ext: no edema Skin: warm, dry Neuro: normal gait and speech    Assessment and Plan   # Depression S: PHQ 9 elevated today with score of 6 on Zoloft/sertraline 100 mg. We changed to zoloft 100mg  from celexa 20mg  in 2016  due to poor control and seemed to work well until recently.   .  Wife has noted episodes of him feeling down every other month increased from every 4 months- episodes seem to be 3-4 days up from 2 days. Has also noted more irritability- upset more quickly.  In the middle of episode felt some improvement with increasing to 2 pills.   Does not feel heavy burden from covid 19. Does miss working out at .   Fortunately no thoughts of self-harm.  Winter is typically a difficult time for him. Depression screen PHQ 2/9 09/12/2019  Decreased Interest 2  Down, Depressed, Hopeless 2  PHQ - 2 Score 4  Altered sleeping 1  Tired, decreased energy 1  Change in appetite 0  Feeling bad or failure about yourself  0  Trouble concentrating 0  Moving slowly or fidgety/restless 0  Suicidal thoughts 0  PHQ-9 Score 6  Difficult doing work/chores -  A/P:  Poor control of depression- titrate to zoloft 150mg  with follow up in 6 weeks planned (we agreed to delay until January CPE if he does really well on this dose and he and wife note improvement). Also discussed potential light therapy in winter months- could give more information at follow up if desired.   Recommended follow up:  Scheduled for 6 weeks- can delay until January  CPE if doing really well Future Appointments  Date Time Provider [place]  10/27/2019  2:40 PM Marin Olp, MD [facility]  12/15/2019  9:20 AM Yong Channel Brayton Mars, MD [facility]   Lab/Order associations:   ICD-10-CM   1. Depression, major, single episode, in partial remission ([place])  F32.4     Meds ordered this encounter  Medications  . sertraline (ZOLOFT) 100 MG tablet    Sig: Take 1.5 tablets (150 mg total) by mouth daily.    Dispense:  135 tablet    Refill:  3    Return precautions advised.  Terry Reddish, MD

## 2019-09-06 NOTE — Patient Instructions (Addendum)
Health Maintenance Due  Topic Date Due  . INFLUENZA VACCINE -07/2019 target 06/24/2019   Increase zoloft to 1.5 tablets. Please let us know immediately if any thoughts of self harm.   Follow up in 6 weeks. Hoping to see some benefit by 2-4 weeks but full benefit by 6 weeks.   In first 2 weeks- may have either some flattness like you did with change to medicine last time or potential increased agitation/irrititability/anxiety- usually after 2 weeks this tapers off and you may not experience it at all

## 2019-09-12 ENCOUNTER — Encounter: Payer: Self-pay | Admitting: Family Medicine

## 2019-09-12 ENCOUNTER — Ambulatory Visit (INDEPENDENT_AMBULATORY_CARE_PROVIDER_SITE_OTHER): Payer: 59 | Admitting: Family Medicine

## 2019-09-12 ENCOUNTER — Other Ambulatory Visit: Payer: Self-pay

## 2019-09-12 VITALS — BP 110/74 | HR 76 | Temp 97.8°F | Ht 75.0 in | Wt 217.2 lb

## 2019-09-12 DIAGNOSIS — F324 Major depressive disorder, single episode, in partial remission: Secondary | ICD-10-CM | POA: Diagnosis not present

## 2019-09-12 MED ORDER — SERTRALINE HCL 100 MG PO TABS: 150.0000 mg | ORAL_TABLET | Freq: Every day | ORAL | 3 refills | Status: DC

## 2019-10-27 ENCOUNTER — Ambulatory Visit: Payer: 59 | Admitting: Family Medicine

## 2019-10-28 ENCOUNTER — Other Ambulatory Visit: Payer: Self-pay | Admitting: Family Medicine

## 2019-11-13 ENCOUNTER — Ambulatory Visit: Payer: 59 | Attending: Internal Medicine

## 2019-11-13 DIAGNOSIS — Z20822 Contact with and (suspected) exposure to covid-19: Secondary | ICD-10-CM

## 2019-11-14 LAB — NOVEL CORONAVIRUS, NAA: SARS-CoV-2, NAA: NOT DETECTED

## 2019-12-14 ENCOUNTER — Other Ambulatory Visit: Payer: Self-pay

## 2019-12-15 ENCOUNTER — Ambulatory Visit (INDEPENDENT_AMBULATORY_CARE_PROVIDER_SITE_OTHER): Payer: BC Managed Care – PPO | Admitting: Family Medicine

## 2019-12-15 ENCOUNTER — Encounter: Payer: Self-pay | Admitting: Family Medicine

## 2019-12-15 VITALS — BP 108/68 | HR 82 | Temp 98.0°F | Ht 75.0 in | Wt 215.0 lb

## 2019-12-15 DIAGNOSIS — Z125 Encounter for screening for malignant neoplasm of prostate: Secondary | ICD-10-CM

## 2019-12-15 DIAGNOSIS — N401 Enlarged prostate with lower urinary tract symptoms: Secondary | ICD-10-CM | POA: Diagnosis not present

## 2019-12-15 DIAGNOSIS — E785 Hyperlipidemia, unspecified: Secondary | ICD-10-CM | POA: Diagnosis not present

## 2019-12-15 DIAGNOSIS — R739 Hyperglycemia, unspecified: Secondary | ICD-10-CM

## 2019-12-15 DIAGNOSIS — Z79899 Other long term (current) drug therapy: Secondary | ICD-10-CM | POA: Diagnosis not present

## 2019-12-15 DIAGNOSIS — K219 Gastro-esophageal reflux disease without esophagitis: Secondary | ICD-10-CM | POA: Diagnosis not present

## 2019-12-15 DIAGNOSIS — F325 Major depressive disorder, single episode, in full remission: Secondary | ICD-10-CM

## 2019-12-15 DIAGNOSIS — Z0000 Encounter for general adult medical examination without abnormal findings: Secondary | ICD-10-CM

## 2019-12-15 DIAGNOSIS — R351 Nocturia: Secondary | ICD-10-CM

## 2019-12-15 LAB — LIPID PANEL
Cholesterol: 209 mg/dL — ABNORMAL HIGH (ref 0–200)
HDL: 36.8 mg/dL — ABNORMAL LOW (ref >39.00)
LDL Cholesterol: 132 mg/dL — ABNORMAL HIGH (ref 0–99)
NonHDL: 171.81
Total CHOL/HDL Ratio: 6
Triglycerides: 197 mg/dL — ABNORMAL HIGH (ref 0.0–149.0)
VLDL: 39.4 mg/dL (ref 0.0–40.0)

## 2019-12-15 LAB — CBC WITH DIFFERENTIAL/PLATELET
Basophils Absolute: 0.1 10*3/uL (ref 0.0–0.1)
Basophils Relative: 1 % (ref 0.0–3.0)
Eosinophils Absolute: 0.3 10*3/uL (ref 0.0–0.7)
Eosinophils Relative: 3.1 % (ref 0.0–5.0)
HCT: 47 % (ref 39.0–52.0)
Hemoglobin: 16.1 g/dL (ref 13.0–17.0)
Lymphocytes Relative: 25.7 % (ref 12.0–46.0)
Lymphs Abs: 2.3 10*3/uL (ref 0.7–4.0)
MCHC: 34.2 g/dL (ref 30.0–36.0)
MCV: 91.9 fl (ref 78.0–100.0)
Monocytes Absolute: 0.8 10*3/uL (ref 0.1–1.0)
Monocytes Relative: 8.6 % (ref 3.0–12.0)
Neutro Abs: 5.5 10*3/uL (ref 1.4–7.7)
Neutrophils Relative %: 61.6 % (ref 43.0–77.0)
Platelets: 344 10*3/uL (ref 150.0–400.0)
RBC: 5.12 Mil/uL (ref 4.22–5.81)
RDW: 12.9 % (ref 11.5–15.5)
WBC: 9 10*3/uL (ref 4.0–10.5)

## 2019-12-15 LAB — COMPREHENSIVE METABOLIC PANEL
ALT: 24 U/L (ref 0–53)
AST: 22 U/L (ref 0–37)
Albumin: 4.4 g/dL (ref 3.5–5.2)
Alkaline Phosphatase: 70 U/L (ref 39–117)
BUN: 15 mg/dL (ref 6–23)
CO2: 27 mEq/L (ref 19–32)
Calcium: 10.3 mg/dL (ref 8.4–10.5)
Chloride: 103 mEq/L (ref 96–112)
Creatinine, Ser: 0.84 mg/dL (ref 0.40–1.50)
GFR: 93.68 mL/min (ref >60.00)
Glucose, Bld: 101 mg/dL — ABNORMAL HIGH (ref 70–99)
Potassium: 4.5 mEq/L (ref 3.5–5.1)
Sodium: 140 mEq/L (ref 135–145)
Total Bilirubin: 0.5 mg/dL (ref 0.2–1.2)
Total Protein: 7.3 g/dL (ref 6.0–8.3)

## 2019-12-15 LAB — VITAMIN B12: Vitamin B-12: 243 pg/mL (ref 211–911)

## 2019-12-15 LAB — PSA: PSA: 1.02 ng/mL (ref 0.10–4.00)

## 2019-12-15 NOTE — Progress Notes (Signed)
Phone: [phone]   Subjective:  Patient presents today for their annual physical. Chief complaint-noted.   See problem oriented charting- Review of Systems  Constitutional: Negative for chills and fever.  HENT: Positive for hearing loss. Negative for ear discharge, ear pain, sinus pain and tinnitus.        Right ear hearing loss   Eyes: Negative for blurred vision, double vision, photophobia and pain.  Respiratory: Negative for cough, shortness of breath and wheezing.   Cardiovascular: Negative for chest pain, palpitations and leg swelling.  Gastrointestinal: Negative for blood in stool, constipation, diarrhea, heartburn, nausea and vomiting.  Genitourinary: Negative.   Musculoskeletal: Negative for back pain, joint pain and neck pain.  Skin: Negative.   Neurological: Negative for dizziness, tremors, speech change and seizures.  Endo/Heme/Allergies: Does not bruise/bleed easily.  Psychiatric/Behavioral: Negative.    The following were reviewed and entered/updated in epic: Past Medical History:  Diagnosis Date  . Allergy   . Anxiety   . Chronic sinusitis   . Depression   . GERD (gastroesophageal reflux disease)    Patient Active Problem List   Diagnosis Date Noted  . BPH associated with nocturia 11/05/2017    Priority: Medium  . Hearing loss of right ear 05/05/2017    Priority: Medium  . Hyperglycemia 11/04/2016    Priority: Medium  . Hyperlipidemia 11/04/2015    Priority: Medium  . History of alcohol abuse 12/24/2014    Priority: Medium  . Depression 10/07/2007    Priority: Medium  . Erectile dysfunction 12/24/2014    Priority: Low  . Allergic rhinitis 10/22/2010    Priority: Low  . GERD 10/07/2007    Priority: Low   Past Surgical History:  Procedure Laterality Date  . COLONOSCOPY    . egd with dilatation  2001    Family History  Problem Relation Age of Onset  . Depression Mother        sister, brother  . Deep vein thrombosis Father        PE after  fracture  . Depression Father   . Hyperlipidemia Father   . Colon cancer Neg Hx     Medications- reviewed and updated Current Outpatient Medications  Medication Sig Dispense Refill  . ibuprofen (ADVIL,MOTRIN) 200 MG tablet Take 200 mg by mouth as needed.    Marland Kitchen omeprazole (PRILOSEC) 20 MG capsule Take 20 mg by mouth daily.    . sertraline (ZOLOFT) 100 MG tablet Take 1.5 tablets (150 mg total) by mouth daily. 135 tablet 3  . sildenafil (REVATIO) 20 MG tablet TAKE ONE TABLET BY MOUTH THREE TIMES A DAY 90 tablet 0   No current facility-administered medications for this visit.    Allergies-reviewed and updated Allergies  Allergen Reactions  . Penicillins     hives    Social History   Social History Narrative   Married. 3 children, 2 step kids. No grandkids.       Works in Media planner: workout regularly, theatre, church involvement   Objective  Objective:  BP 108/68   Pulse 82   Temp 98 F (36.7 C) (Temporal)   Ht 6\' 3"  (1.905 m)   Wt 215 lb (97.5 kg)   SpO2 96%   BMI 26.87 kg/m  Gen: NAD, resting comfortably HEENT: Mask not removed due to covid 19. TM normal. Bridge of nose normal. Eyelids normal.  Neck: no thyromegaly or cervical lymphadenopathy  CV: RRR no murmurs rubs or gallops Lungs:  CTAB no crackles, wheeze, rhonchi Abdomen: soft/nontender/nondistended/normal bowel sounds. No rebound or guarding.  Ext: no edema Skin: warm, dry Neuro: grossly normal, moves all extremities, PERRLA   Assessment and Plan  59 y.o. male presenting for annual physical.  Health Maintenance counseling: 1. Anticipatory guidance: Patient counseled regarding regular dental exams q6 months, eye exams have not had in 2-3 years- no issues with vision- is planning to get updated exam,  avoiding smoking and second hand smoke , limiting alcohol to 2 beverages per day . Has not drank in over 18 years  2. Risk factor reduction:  Advised patient of need for  regular exercise and diet rich and fruits and vegetables to reduce risk of heart attack and stroke. Exercise- very active walks over 2 miles a day with dogs. IT band exercises helpful last year.  Diet-wants to work on reducing sugar and portion size.  He has done a great job maintaining weight over last year. Around 212 at home. Long term goal 195-198 on home scales.  Wt Readings from Last 3 Encounters:  12/15/19 215 lb (97.5 kg)  09/12/19 217 lb 3.2 oz (98.5 kg)  12/08/18 215 lb 12.8 oz (97.9 kg)  3. Immunizations/screenings/ancillary studies-up-to-date.  Discussed COVID-19 vaccine and interested if available  Immunization History  Administered Date(s) Administered  . Influenza Inj Mdck Quad Pf 07/24/2019  . Influenza Whole 10/07/2007  . Influenza,inj,Quad PF,6+ Mos 11/04/2016, 08/08/2019  . Influenza-Unspecified 07/20/2017, 08/23/2018  . Td 06/30/2010  . Zoster Recombinat (Shingrix) 12/08/2018, 03/22/2019  4. Prostate cancer screening- prior PSA trend has been low risk.  Update PSA today and at least through age 50.  We have opted to defer rectal unless PSA trend concerning.  Stable nocturia twice a night and also has some dribbling with his BPH  Lab Results  Component Value Date   PSA 1.08 12/08/2018   PSA 1.19 11/05/2017   PSA 1.44 10/29/2016   5. Colon cancer screening - next due in 2025-normal September 25, 2014 6. Skin cancer screening-no dermatologist - considering getting in for a visit.  Advised regular sunscreen use. Denies worrisome, changing, or new skin lesions.  7. Never smoker  Status of chronic or acute concerns  Sildenafil continues to help with erectile dysfunction   GERD- Prilosec is helpful.  Last year advised trial of Zantac but unfortunately that is on recall- didn't work and he had to go back to prilosec regardless.  We discussed possible trial of Pepcid. Check b12   Depression in full remission -remains on Zoloft 150 mg increased at October visit from 100 mg.   The winter is generally tougher for him.  PHQ 9 is controlled today at 0.  We opted to continue current medication   Hyperglycemia- has had elevated CBG in the past but A1c was normal at 5.6- opted to hold off this year with stable weight- if weight increases would recheck- plus regular walking likely reducing risk  Lab Results  Component Value Date   HGBA1C 5.6 12/08/2018   HGBA1C 5.4 05/05/2017   Hyperlipidemia- has wanted to focus on lifestyle.  Will calculate 10-year ASCVD risk this year.  Based off last year's labs 10-year risk was 7.7%-we discussed this is another consideration for statin range- hed like to focus on lifestyle   Hearing loss of right ear-followed with Dr. Lazarus Salines in 2018- hasnt followed up- but no worsening of hearing since that time- considering getting into him or audiology   Last year with some pain on lateral leg .  He has continued to try to walk his dogs a lot. On exam- weak lateral hip abductors, tight over IT band. Gave IT band handout to try last year. this helped   May try probiotic- gets some gaseous distension . Could limit fodmap. May try probiotic  Recommended follow up: Return in about 1 year (around 12/14/2020) for physical or sooner if needed.  Lab/Order associations:cup of coffee with half and half. not fasting   ICD-10-CM   1. Preventative health care  Z00.00 CBC with Differential/Platelet    Comprehensive metabolic panel    Lipid panel    PSA  2. Hyperlipidemia, unspecified hyperlipidemia type  E78.5 CBC with Differential/Platelet    Comprehensive metabolic panel    Lipid panel  3. Hyperglycemia  R73.9   4. BPH associated with nocturia  N40.1    R35.1   5. Gastroesophageal reflux disease without esophagitis  K21.9 Vitamin B12  6. Major depressive disorder with single episode, in full remission ([place])  F32.5   7. Screening for prostate cancer  Z12.5 PSA  8. High risk medication use  Z79.899 Vitamin B12   Return precautions advised.  Garret Reddish, MD

## 2019-12-15 NOTE — Patient Instructions (Addendum)
Please stop by lab before you go If you do not have mychart- we will call you about results within 5 business days of [place] receiving them.  If you have mychart- we will send your results within 3 business days of [place] receiving them.  If abnormal or we want to clarify a result, we will call or mychart you to make sure you receive the message.  If you have questions or concerns or don't hear within 5-7 days, please send [place] a message or call us.   Could trial pepcid instead of prilosec  Recommended follow up: Return in about 1 year (around 12/14/2020) for physical or sooner if needed. - sooner if depression not going well

## 2020-01-22 ENCOUNTER — Other Ambulatory Visit: Payer: Self-pay | Admitting: Family Medicine

## 2020-06-10 ENCOUNTER — Other Ambulatory Visit: Payer: Self-pay | Admitting: Family Medicine

## 2020-09-05 ENCOUNTER — Other Ambulatory Visit: Payer: Self-pay | Admitting: Family Medicine

## 2020-09-18 ENCOUNTER — Other Ambulatory Visit: Payer: Self-pay | Admitting: Family Medicine

## 2021-02-20 ENCOUNTER — Telehealth: Payer: Self-pay | Admitting: Family Medicine

## 2021-02-21 ENCOUNTER — Telehealth: Payer: Self-pay

## 2021-02-21 ENCOUNTER — Other Ambulatory Visit: Payer: Self-pay

## 2021-02-21 MED ORDER — SILDENAFIL CITRATE 20 MG PO TABS: 20.0000 mg | ORAL_TABLET | Freq: Three times a day (TID) | ORAL | 0 refills | Status: DC

## 2021-02-21 NOTE — Telephone Encounter (Signed)
Rx sent, appt scheduled for Aug for CPE.

## 2021-02-21 NOTE — Telephone Encounter (Signed)
Patient has enough to last to the weekend, wondering why this was denied when he is scheduled for the first available physical.

## 2021-02-21 NOTE — Telephone Encounter (Signed)
Rx sent 

## 2021-02-21 NOTE — Telephone Encounter (Signed)
Medication below needs to be sent to pharmacy below  sildenafil (REVATIO) 20 MG tablet     [facility] [address] [place] - [address]

## 2021-03-17 NOTE — Telephone Encounter (Signed)
[facility] has called regarding prior auth for sildenafil (REVATIO) 20 MG tablet  please call back to discuss [phone]

## 2021-03-18 NOTE — Telephone Encounter (Signed)
Returned the call to [facility] and the clinician states that sildenifil 20mg  will not be covered b/c pt does not have a diagnosis of Pulmonary Arterial HTN or Reynaud's phenomenon. Other strengths may be approved but pt will have to call the number on the back of his insurance card for other strength options. A letter will be sent to pt explaining this.

## 2021-03-28 ENCOUNTER — Encounter: Payer: BC Managed Care – PPO | Admitting: Family Medicine

## 2021-05-09 ENCOUNTER — Other Ambulatory Visit: Payer: Self-pay

## 2021-05-09 ENCOUNTER — Encounter: Payer: Self-pay | Admitting: Family

## 2021-05-09 ENCOUNTER — Ambulatory Visit (INDEPENDENT_AMBULATORY_CARE_PROVIDER_SITE_OTHER): Payer: BC Managed Care – PPO | Admitting: Family

## 2021-05-09 VITALS — BP 146/76 | HR 77 | Temp 97.3°F | Ht 75.0 in | Wt 218.2 lb

## 2021-05-09 DIAGNOSIS — B354 Tinea corporis: Secondary | ICD-10-CM | POA: Diagnosis not present

## 2021-05-09 MED ORDER — ITRACONAZOLE 100 MG PO CAPS: 200.0000 mg | ORAL_CAPSULE | ORAL | 0 refills | Status: DC

## 2021-05-09 NOTE — Patient Instructions (Signed)
Body Ringworm Body ringworm is an infection of the skin that often causes a ring-shaped rash. Body ringworm is also called tinea corporis. Body ringworm can affect any part of your skin. This condition is easily spread from person to person (is very contagious). What are the causes? This condition is caused by fungi called dermatophytes. The condition develops when these fungi grow out of control on the skin. You can get this condition if you touch a person or animal that has it. You can also get it if you share any items with an infected person or pet. These include: Clothing, bedding, and towels. Brushes or combs. Gym equipment. Any other object that has the fungus on it. What increases the risk? You are more likely to develop this condition if you: Play sports that involve close physical contact, such as wrestling. Sweat a lot. Live in areas that are hot and humid. Use public showers. Have a weakened immune system. What are the signs or symptoms? Symptoms of this condition include: Itchy, raised red spots and bumps. Red scaly patches. A ring-shaped rash. The rash may have: A clear center. Scales or red bumps at its center. Redness near its borders. Dry and scaly skin on or around it. How is this diagnosed? This condition can usually be diagnosed with a skin exam. A skin scraping may be taken from the affected area and examined under a microscope to see if the fungus is present. How is this treated? This condition may be treated with: An antifungal cream or ointment. An antifungal shampoo. Antifungal medicines. These may be prescribed if your ringworm: Is severe. Keeps coming back. Lasts a long time. Follow these instructions at home: Take over-the-counter and prescription medicines only as told by your health care provider. If you were given an antifungal cream or ointment: Use it as told by your health care provider. Wash the infected area and dry it completely before  applying the cream or ointment. If you were given an antifungal shampoo: Use it as told by your health care provider. Leave the shampoo on your body for 3-5 minutes before rinsing. While you have a rash: Wear loose clothing to stop clothes from rubbing and irritating it. Wash or change your bed sheets every night. Disinfect or throw out items that may be infected. Wash clothes and bed sheets in hot water. Wash your hands often with soap and water. If soap and water are not available, use hand sanitizer. If your pet has the same infection, take your pet to see a veterinarian for treatment. How is this prevented? Take a bath or shower every day and after every time you work out or play sports. Dry your skin completely after bathing. Wear sandals or shoes in public places and showers. Change your clothes every day. Wash athletic clothes after each use. Do not share personal items with others. Avoid touching red patches of skin on other people. Avoid touching pets that have bald spots. If you touch an animal that has a bald spot, wash your hands. Contact a health care provider if: Your rash continues to spread after 7 days of treatment. Your rash is not gone in 4 weeks. The area around your rash gets red, warm, tender, and swollen. Summary Body ringworm is an infection of the skin that often causes a ring-shaped rash. This condition is easily spread from person to person (is very contagious). This condition may be treated with antifungal cream or ointment, antifungal shampoo, or antifungal medicines. Take over-the-counter and   prescription medicines only as told by your health care provider. This information is not intended to replace advice given to you by your health care provider. Make sure you discuss any questions you have with your health care provider. Document Revised: 07/08/2018 Document Reviewed: 07/08/2018 Elsevier Patient Education  2022 Elsevier Inc.  

## 2021-05-09 NOTE — Progress Notes (Signed)
Acute Office Visit  Subjective:    Patient ID: Terry Mcpherson, male    DOB: 08/31/1961, 60 y.o.   MRN: [id number]  Chief Complaint  Patient presents with  . Rash    Under rt arm     HPI Patient is in today with concerns of a rash underneath his right arm that is spreading down the right side of his torso has been present for several weeks.  He has been applying hydrocortisone cream, antifungal creams that do not completely rid the rash.  Has a similar rash in the groin area.  Reports that he sweats a lot.  Past Medical History:  Diagnosis Date  . Allergy   . Anxiety   . Chronic sinusitis   . Depression   . GERD (gastroesophageal reflux disease)     Past Surgical History:  Procedure Laterality Date  . COLONOSCOPY    . egd with dilatation  2001    Family History  Problem Relation Age of Onset  . Depression Mother        sister, brother  . Deep vein thrombosis Father        PE after fracture  . Depression Father   . Hyperlipidemia Father   . Colon cancer Neg Hx     Social History   Socioeconomic History  . Marital status: Married    Spouse name: Not on file  . Number of children: Not on file  . Years of education: Not on file  . Highest education level: Not on file  Occupational History  . Not on file  Tobacco Use  . Smoking status: Never  . Smokeless tobacco: Never  Substance and Sexual Activity  . Alcohol use: No    Alcohol/week: 0.0 standard drinks  . Drug use: No  . Sexual activity: Not on file  Other Topics Concern  . Not on file  Social History Narrative   Married. 3 children, 2 step kids. No grandkids.       Works in Airline pilot- [employer]      Hobbies: workout regularly, Surveyor, quantity, church involvement   Social Determinants of Corporate investment banker Strain: Not on [employer] Insecurity: Not on file  Transportation Needs: Not on file  Physical Activity: Not on file  Stress: Not on file  Social Connections: Not on file   Intimate Partner Violence: Not on file    Outpatient Medications Prior to Visit  Medication Sig Dispense Refill  . ibuprofen (ADVIL,MOTRIN) 200 MG tablet Take 200 mg by mouth as needed.    Marland Kitchen omeprazole (PRILOSEC) 20 MG capsule Take 20 mg by mouth daily.    . sertraline (ZOLOFT) 100 MG tablet TAKE 1.5 TABLETS BY MOUTH DAILY 135 tablet 3  . sildenafil (REVATIO) 20 MG tablet Take 1 tablet (20 mg total) by mouth 3 (three) times daily. 90 tablet 0   No facility-administered medications prior to visit.    Allergies  Allergen Reactions  . Penicillins     hives    Review of Systems  Musculoskeletal:        Right axilla  Skin:  Positive for rash.  All other systems reviewed and are negative.     Objective:    Physical Exam Vitals and nursing note reviewed.  Constitutional:      Appearance: Normal appearance.  Cardiovascular:     Rate and Rhythm: Normal rate and regular rhythm.  Pulmonary:     Effort: Pulmonary effort is normal.  Breath sounds: Normal breath sounds.  Musculoskeletal:     Cervical back: Normal range of motion and neck supple.  Skin:    General: Skin is warm and dry.     Findings: Rash present.     Comments: Well-defined, mildly erythematous rash noted to the right axilla.  Dry, flaky.  Small areas similar to this rash with well-defined borders extending down the right torso.  Neurological:     General: No focal deficit present.     Mental Status: He is alert and oriented to person, place, and time.  Psychiatric:        Mood and Affect: Mood normal.        Behavior: Behavior normal.   BP (!) 146/76   Pulse 77   Temp (!) 97.3 F (36.3 C) (Temporal)   Ht 6\' 3"  (1.905 m)   Wt 218 lb 3.2 oz (99 kg)   SpO2 96%   BMI 27.27 kg/m  Wt Readings from Last 3 Encounters:  05/09/21 218 lb 3.2 oz (99 kg)  12/15/19 215 lb (97.5 kg)  09/12/19 217 lb 3.2 oz (98.5 kg)    Health Maintenance Due  Topic Date Due  . Pneumococcal Vaccine 59-31 Years old (1 - PCV)  Never done  . TETANUS/TDAP  06/30/2020    There are no preventive care reminders to display for this patient.   Lab Results  Component Value Date   TSH 1.16 10/29/2016   Lab Results  Component Value Date   WBC 9.0 12/15/2019   HGB 16.1 12/15/2019   HCT 47.0 12/15/2019   MCV 91.9 12/15/2019   PLT 344.0 12/15/2019   Lab Results  Component Value Date   NA 140 12/15/2019   K 4.5 12/15/2019   CO2 27 12/15/2019   GLUCOSE 101 (H) 12/15/2019   BUN 15 12/15/2019   CREATININE 0.84 12/15/2019   BILITOT 0.5 12/15/2019   ALKPHOS 70 12/15/2019   AST 22 12/15/2019   ALT 24 12/15/2019   PROT 7.3 12/15/2019   ALBUMIN 4.4 12/15/2019   CALCIUM 10.3 12/15/2019   GFR 93.68 12/15/2019   Lab Results  Component Value Date   CHOL 209 (H) 12/15/2019   Lab Results  Component Value Date   HDL 36.80 (L) 12/15/2019   Lab Results  Component Value Date   LDLCALC 132 (H) 12/15/2019   Lab Results  Component Value Date   TRIG 197.0 (H) 12/15/2019   Lab Results  Component Value Date   CHOLHDL 6 12/15/2019   Lab Results  Component Value Date   HGBA1C 5.6 12/08/2018       Assessment & Plan:   Problem List Items Addressed This Visit   None Visit Diagnoses     Tinea corporis    -  Primary   Relevant Medications   itraconazole (SPORANOX) 100 MG capsule        Meds ordered this encounter  Medications  . itraconazole (SPORANOX) 100 MG capsule    Sig: Take 2 capsules (200 mg total) by mouth once a week.    Dispense:  8 capsule    Refill:  0   Advised patient to take medication prior to any event where he will likely sweat.  Selsun Blue shampoo applied to the area to be lather prior to showers.  Guidance given on tinea corporis.  Call the office with any questions or concerns.  Recheck as scheduled and sooner as needed.  12/10/2018, FNP

## 2021-05-13 DIAGNOSIS — Z23 Encounter for immunization: Secondary | ICD-10-CM | POA: Diagnosis not present

## 2021-05-13 DIAGNOSIS — Z7184 Encounter for health counseling related to travel: Secondary | ICD-10-CM | POA: Diagnosis not present

## 2021-06-03 ENCOUNTER — Ambulatory Visit (INDEPENDENT_AMBULATORY_CARE_PROVIDER_SITE_OTHER): Payer: BC Managed Care – PPO | Admitting: Physician Assistant

## 2021-06-03 ENCOUNTER — Encounter: Payer: Self-pay | Admitting: Physician Assistant

## 2021-06-03 ENCOUNTER — Other Ambulatory Visit: Payer: Self-pay

## 2021-06-03 VITALS — BP 138/86 | HR 69 | Temp 97.9°F | Ht 75.0 in | Wt 215.0 lb

## 2021-06-03 DIAGNOSIS — B354 Tinea corporis: Secondary | ICD-10-CM | POA: Diagnosis not present

## 2021-06-03 LAB — COMPREHENSIVE METABOLIC PANEL
ALT: 33 U/L (ref 0–53)
AST: 23 U/L (ref 0–37)
Albumin: 4.3 g/dL (ref 3.5–5.2)
Alkaline Phosphatase: 68 U/L (ref 39–117)
BUN: 14 mg/dL (ref 6–23)
CO2: 27 mEq/L (ref 19–32)
Calcium: 9.4 mg/dL (ref 8.4–10.5)
Chloride: 104 mEq/L (ref 96–112)
Creatinine, Ser: 0.82 mg/dL (ref 0.40–1.50)
GFR: 95.81 mL/min (ref >60.00)
Glucose, Bld: 80 mg/dL (ref 70–99)
Potassium: 3.8 mEq/L (ref 3.5–5.1)
Sodium: 138 mEq/L (ref 135–145)
Total Bilirubin: 0.4 mg/dL (ref 0.2–1.2)
Total Protein: 7.4 g/dL (ref 6.0–8.3)

## 2021-06-03 MED ORDER — TERBINAFINE HCL 250 MG PO TABS: 250.0000 mg | ORAL_TABLET | Freq: Every day | ORAL | 0 refills | Status: AC

## 2021-06-03 NOTE — Patient Instructions (Signed)
Call with update, recheck prn

## 2021-06-03 NOTE — Progress Notes (Signed)
Acute Office Visit  Subjective:    Patient ID: Terry Mcpherson, male    DOB: Apr 13, 1961, 60 y.o.   MRN: [id number]  Chief Complaint  Patient presents with   Rash    Rash  Patient is in today for rash x several months. Located in both axilla, groin, sometimes in between feet. Visit at our office last month and was Rx'd Itraconazole. Took this medication and states it did not help at all, in fact it seems to be spreading. He did not try Selsun blue method. Doesn't itch or hurt, just isn't going away.   Past Medical History:  Diagnosis Date   Allergy    Anxiety    Chronic sinusitis    Depression    GERD (gastroesophageal reflux disease)     Past Surgical History:  Procedure Laterality Date   COLONOSCOPY     egd with dilatation  2001    Family History  Problem Relation Age of Onset   Depression Mother        sister, brother   Deep vein thrombosis Father        PE after fracture   Depression Father    Hyperlipidemia Father    Colon cancer Neg Hx     Social History   Socioeconomic History   Marital status: Married    Spouse name: Not on file   Number of children: Not on file   Years of education: Not on file   Highest education level: Not on file  Occupational History   Not on file  Tobacco Use   Smoking status: Never   Smokeless tobacco: Never  Substance and Sexual Activity   Alcohol use: No    Alcohol/week: 0.0 standard drinks   Drug use: No   Sexual activity: Not on file  Other Topics Concern   Not on file  Social History Narrative   Married. 3 children, 2 step kids. No grandkids.       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     sertraline (ZOLOFT) 100 MG tablet TAKE 1.5 TABLETS BY MOUTH DAILY 135 tablet 3   sildenafil (REVATIO) 20 MG tablet Take 1 tablet (20 mg total) by mouth 3 (three) times daily. 90 tablet 0   itraconazole (SPORANOX) 100 MG capsule Take 2 capsules (200 mg total) by mouth once a week. 8 capsule 0   ibuprofen (ADVIL,MOTRIN) 200 MG tablet Take 200 mg by mouth as needed.     No facility-administered medications prior to visit.    Allergies  Allergen Reactions   Penicillins     hives    Review of Systems  Skin:  Positive for rash.  REFER TO HPI FOR PERTINENT POSITIVES AND NEGATIVES     Objective:    Physical Exam Vitals and nursing note reviewed.  Constitutional:      Appearance: Normal appearance.  Cardiovascular:     Rate and Rhythm: Normal rate and regular rhythm.  Pulmonary:     Effort: Pulmonary  effort is normal.     Breath sounds: Normal breath sounds.  Musculoskeletal:     Cervical back: Normal range of motion and neck supple.  Skin:    General: Skin is warm and dry.     Findings: Rash present.     Comments: Well-defined, mildly erythematous oval rash noted to the right axilla.  Dry, flaky.  Small areas similar to this rash with well-defined borders extending down the right torso. Now along left axilla and right humerus as well.   Neurological:     General: No focal deficit present.     Mental Status: He is alert and oriented to person, place, and time.  Psychiatric:        Mood and Affect: Mood normal.        Behavior: Behavior normal.    BP 138/86   Pulse 69   Temp 97.9 F (36.6 C)   Ht 6\' 3"  (1.905 m)   Wt 215 lb (97.5 kg)   SpO2 96%   BMI 26.87 kg/m  Wt Readings from Last 3 Encounters:  06/03/21 215 lb (97.5 kg)  05/09/21 218 lb 3.2 oz (99 kg)  12/15/19 215 lb (97.5 kg)    Health Maintenance Due  Topic  Date Due   Pneumococcal Vaccine 3-15 Years old (1 - PCV) Never done    There are no preventive care reminders to display for this patient.   Lab Results  Component Value Date   TSH 1.16 10/29/2016   Lab Results  Component Value Date   WBC 9.0 12/15/2019   HGB 16.1 12/15/2019   HCT 47.0 12/15/2019   MCV 91.9 12/15/2019   PLT 344.0 12/15/2019   Lab Results  Component Value Date   NA 140 12/15/2019   K 4.5 12/15/2019   CO2 27 12/15/2019   GLUCOSE 101 (H) 12/15/2019   BUN 15 12/15/2019   CREATININE 0.84 12/15/2019   BILITOT 0.5 12/15/2019   ALKPHOS 70 12/15/2019   AST 22 12/15/2019   ALT 24 12/15/2019   PROT 7.3 12/15/2019   ALBUMIN 4.4 12/15/2019   CALCIUM 10.3 12/15/2019   GFR 93.68 12/15/2019   Lab Results  Component Value Date   CHOL 209 (H) 12/15/2019   Lab Results  Component Value Date   HDL 36.80 (L) 12/15/2019   Lab Results  Component Value Date   LDLCALC 132 (H) 12/15/2019   Lab Results  Component Value Date   TRIG 197.0 (H) 12/15/2019   Lab Results  Component Value Date   CHOLHDL 6 12/15/2019   Lab Results  Component Value Date   HGBA1C 5.6 12/08/2018       Assessment & Plan:   Problem List Items Addressed This Visit   None Visit Diagnoses     Tinea corporis    -  Primary   Relevant Medications   terbinafine (LAMISIL) 250 MG tablet   Other Relevant Orders   Comprehensive metabolic panel        Meds ordered this encounter  Medications   terbinafine (LAMISIL) 250 MG tablet    Sig: Take 1 tablet (250 mg total) by mouth daily.    Dispense:  30 tablet    Refill:  0   1. Tinea corporis Agree this is still fungal infection. Will try Terbinafine 250 mg one tablet daily. Check baseline LFTs today. Also needs to apply Head and Shoulders shampoo to affected areas in the shower at least 2-3x / week. Discussed dermatology referral if this treatment plan fails.  Virlee Stroschein M Dushawn Pusey, PA-C

## 2021-06-26 ENCOUNTER — Encounter: Payer: BC Managed Care – PPO | Admitting: Family Medicine

## 2021-07-07 ENCOUNTER — Telehealth: Payer: Self-pay

## 2021-07-07 NOTE — Telephone Encounter (Signed)
Error

## 2021-07-30 NOTE — Progress Notes (Signed)
Phone: [phone]   Subjective:  Patient presents today for their annual physical. Chief complaint-noted.   See problem oriented charting- Review of Systems  Constitutional:  Negative for chills and fever.  HENT:  Positive for hearing loss and tinnitus. Negative for ear discharge and ear pain.   Eyes:  Negative for blurred vision and double vision.  Respiratory:  Negative for cough and shortness of breath.   Cardiovascular:  Negative for chest pain and palpitations.  Gastrointestinal:  Negative for abdominal pain, blood in stool, heartburn, melena, nausea and vomiting.  Genitourinary:  Negative for dysuria and frequency.  Musculoskeletal:  Positive for myalgias. Negative for neck pain.  Skin:  Positive for rash. Negative for itching.  Neurological:  Negative for dizziness and headaches.  Endo/Heme/Allergies:  Negative for polydipsia. Does not bruise/bleed easily.  Psychiatric/Behavioral:  Negative for depression and suicidal ideas.    The following were reviewed and entered/updated in epic: Past Medical History:  Diagnosis Date   Allergy    Anxiety    Chronic sinusitis    Depression    GERD (gastroesophageal reflux disease)    Patient Active Problem List   Diagnosis Date Noted   BPH associated with nocturia 11/05/2017    Priority: Medium   Hearing loss of right ear 05/05/2017    Priority: Medium   Hyperglycemia 11/04/2016    Priority: Medium   Hyperlipidemia 11/04/2015    Priority: Medium   History of alcohol abuse 12/24/2014    Priority: Medium   Depression 10/07/2007    Priority: Medium   Erectile dysfunction 12/24/2014    Priority: Low   Allergic rhinitis 10/22/2010    Priority: Low   GERD 10/07/2007    Priority: Low   Past Surgical History:  Procedure Laterality Date   COLONOSCOPY     egd with dilatation  2001    Family History  Problem Relation Age of Onset   Depression Mother        sister, brother   Deep vein thrombosis Father        PE after  fracture   Depression Father    Hyperlipidemia Father    Colon cancer Neg Hx     Medications- reviewed and updated Current Outpatient Medications  Medication Sig Dispense Refill   omeprazole (PRILOSEC) 20 MG capsule Take 20 mg by mouth daily.     sertraline (ZOLOFT) 100 MG tablet TAKE 1.5 TABLETS BY MOUTH DAILY 135 tablet 3   sildenafil (REVATIO) 20 MG tablet TAKE ONE TABLET BY MOUTH THREE TIMES A DAY 90 tablet 0   sildenafil (REVATIO) 20 MG tablet Take 1-3 tablets daily as needed 90 tablet 3   No current facility-administered medications for this visit.    Allergies-reviewed and updated Allergies  Allergen Reactions   Penicillins     hives    Social History   Social History Narrative   Married. 3 children, 2 step kids. No grandkids.       Works in Media planner: workout regularly, theatre, church involvement   Objective  Objective:  BP 126/74   Pulse 73   Temp 98.4 F (36.9 C)   Resp 16   Ht 6\' 3"  (1.905 m)   Wt 215 lb 3.2 oz (97.6 kg)   SpO2 98%   BMI 26.90 kg/m  Gen: NAD, resting comfortably HEENT: Mucous membranes are moist. Oropharynx normal Neck: no thyromegaly CV: RRR no murmurs rubs or gallops Lungs: CTAB no crackles, wheeze, rhonchi  Abdomen: soft/nontender/nondistended/normal bowel sounds. No rebound or guarding.  Ext: no edema Skin: warm, dry Neuro: grossly normal, moves all extremities, PERRLA    Assessment and Plan  60 y.o. male presenting for annual physical.  Health Maintenance counseling: 1. Anticipatory guidance: Patient counseled regarding regular dental exams -q6 months, eye exams - has seen eye doctor since last visit and no issues ,  avoiding smoking and second hand smoke , limiting alcohol to 2 beverages per day- Had not drank in over 20 years- still doing AA.  No illicit drugs  2. Risk factor reduction:  Advised patient of need for regular exercise and diet rich and fruits and vegetables to reduce risk  of heart attack and stroke. Exercise- back to [facility] and walking dogs twice a day. Shoulder bothers him- doing balance and stretching Diet-  lost some weight after a GI bug on mission trip- able to keep this down- weight actually stable from 18 months ago. Long term goal under 200 lbs - close on home scales around 210 Wt Readings from Last 3 Encounters:  08/04/21 215 lb 3.2 oz (97.6 kg)  06/03/21 215 lb (97.5 kg)  05/09/21 218 lb 3.2 oz (99 kg)  3. Immunizations/screenings/ancillary studies DISCUSSED:  -Prevnar 20 vaccination #1- this is an epic misfire-he does not yet need this vaccination -Flu vaccination (last one 09/05/2020)- today  -COVID-19 vaccination #5 repeat planned- recommended omicron specific booster at pharmacy- considering november Immunization History  Administered Date(s) Administered   Influenza Inj Mdck Quad Pf 07/24/2019   Influenza Whole 10/07/2007   Influenza,inj,Quad PF,6+ Mos 11/04/2016, 08/08/2019, 09/05/2020   Influenza-Unspecified 07/20/2017, 08/23/2018   PFIZER Comirnaty(Gray Top)Covid-19 Tri-Sucrose Vaccine 03/26/2021   PFIZER(Purple Top)SARS-COV-2 Vaccination 02/04/2020, 02/28/2020, 10/16/2020   Td 06/30/2010, 05/09/2021   Zoster Recombinat (Shingrix) 12/08/2018, 03/22/2019   4. Prostate cancer screening-  prior PSA trend was low risk.  Updated PSA 11/2019 and at least through age 51.  We had opted to defer rectal unless PSA trend concerned.  Stable nocturia twice a night and also had some dribbling with his BPH - update PSA today  Lab Results  Component Value Date   PSA 1.02 12/15/2019   PSA 1.08 12/08/2018   PSA 1.19 11/05/2017   5. Colon cancer screening - normal Colonoscopy 09/25/2014- next due in 2025.  No blood in the stool or melena 6. Skin cancer screening- no dermatologist - refer due to rash and wants screening. advised regular sunscreen use.  7.  never smoker 8. STD screening - only active with wife- declines  Status of chronic or acute concerns    # rash underneath right arm S: Patient was seen for an evaluation on 04/2021 with Worthy Rancher, FNP for tinea corporis. The rash was underneath his right arm that was spreading down the right side of his torso-had been present for several weeks. He been applying hydrocortisone cream, antifungal creams that do not completely get rid of rash. Had a similar rash in the groin area. He also reported he sweated a lot.  -He was prescribed itraconazole 100 mg once potential tinea corporis a week but see below  Visit with Alyssa Allwardt, PA-C on 05/2021:   - He stated Itrazonazole did not help at all and rash continued to spread. He did not try Selsun blue method. Rash doesn't itch or hurt, just wasn't going away.  -He was trialed on terbinafine 250 mg daily.   Unfortunately does not not resolve with treatment-in fact has not improved at all-thankfully not worsening A/P:  With persistent axillary rash bilaterally worse on the right we will refer to dermatology-patient also will need a skin cancer screening visit in the long run as well but the first step is to evaluate the rash.  # erectile dysfunction  S:Sildenafil  20 mg x3 daily   A/P: doing well- refill med   # GERD S:Medication: Prilosec 20 mg daily- 2020 advised trail of Zantac but unfortunately was on recall-didn't work and had to go back to Prilosec regardless. We discussed Pepcid but didn't work -Check B12 - was low normal-reasonable to take a multivitamin with B12 in it to help keep this level up or an isolated B12 supplement such as 250 MCG or 1000 MCG.  -does take MV with b12 B12 levels related to PPI use: Lab Results  Component Value Date   VITAMINB12 243 12/15/2019   A/P: well controlled - did not tolerate pepcid. Prior dilations- but has not needed in years- continue current rx   # Depression S: Medication:Zoloft 150 mg.  -Winter time was generally tougher for him. PH9 was controlled at 0.  Depression screen [facility] 2/9 05/09/2021  12/15/2019 09/12/2019  Decreased Interest 0 0 2  Down, Depressed, Hopeless 0 0 2  PHQ - 2 Score 0 0 4  Altered sleeping 0 0 1  Tired, decreased energy 0 0 1  Change in appetite 0 0 0  Feeling bad or failure about yourself  0 0 0  Trouble concentrating 0 0 0  Moving slowly or fidgety/restless 0 0 0  Suicidal thoughts 0 0 0  PHQ-9 Score 0 0 6  Difficult doing work/chores Not difficult at all Not difficult at all -  A/P: Full remission-continue current medication. Did not tolerate 100 mg in past so will hold off for now  # Hyperglycemia S:  Medication: None -Had elevated CBG in the past but A1c was normal at 5.6-opted to hold off 2021 with stable weight-if weight increased would recheck Lab Results  Component Value Date   HGBA1C 5.6 12/08/2018   HGBA1C 5.4 05/05/2017    A/P: update a1c today as not fasting- can do cbgs in future if fasting  #hyperlipidemia S: Wanted to focus on lifestyle. Based off 2020's labs 10-year risk was 7.7%-we discussed there was another consideration for statin range.  Medication: None Lab Results  Component Value Date   CHOL 209 (H) 12/15/2019   HDL 36.80 (L) 12/15/2019   LDLCALC 132 (H) 12/15/2019   LDLDIRECT 149.0 12/08/2018   TRIG 197.0 (H) 12/15/2019   CHOLHDL 6 12/15/2019   A/P: Update lipid panel and recalculate ASCVD risk-will get ascvd score as well with risk from last labs now at 10.9%- if had astronomical improvement could always cancel  #Hearing loss of right ear-followed with Dr. Lazarus SalinesWolicki in 2018- hasnt followed up- had a virus and worsened- refer back to ENT    Recommended follow up: Return in about 1 year (around 08/04/2022) for physical or sooner if needed..  Lab/Order associations:NOT  fasting   ICD-10-CM   1. Preventative health care  Z00.00 CBC with Differential/Platelet    Comprehensive metabolic panel    Lipid panel    PSA    B12    2. Tinea corporis  B35.4     3. Hyperlipidemia, unspecified hyperlipidemia type  E78.5 CBC  with Differential/Platelet    Comprehensive metabolic panel    Lipid panel    CT CARDIAC SCORING (SELF PAY ONLY)    4. Hyperglycemia  R73.9 Hemoglobin A1c  5. Major depressive disorder with single episode, in full remission (HCC)  F32.5     6. High risk medication use  Z79.899 B12    7. Screening for prostate cancer  Z12.5 PSA    8. Screening exam for skin cancer  Z12.83 Ambulatory referral to Dermatology    9. Rash  R21 Ambulatory referral to Dermatology    10. Hearing loss of right ear, unspecified hearing loss type  H91.91 Ambulatory referral to ENT      Meds ordered this encounter  Medications   sildenafil (REVATIO) 20 MG tablet    Sig: Take 1-3 tablets daily as needed    Dispense:  90 tablet    Refill:  3     I,Jada Bradford,acting as a scribe for [name], MD.,have documented all relevant documentation on the behalf of [name], MD,as directed by  [name], MD while in the presence of [name], MD.  I, [name], MD, have reviewed all documentation for this visit. The documentation on 08/04/21 for the exam, diagnosis, procedures, and orders are all accurate and complete.  Return precautions advised.  [name], MD

## 2021-08-04 ENCOUNTER — Other Ambulatory Visit: Payer: Self-pay | Admitting: Family Medicine

## 2021-08-04 ENCOUNTER — Ambulatory Visit (INDEPENDENT_AMBULATORY_CARE_PROVIDER_SITE_OTHER): Payer: BC Managed Care – PPO | Admitting: Family Medicine

## 2021-08-04 ENCOUNTER — Other Ambulatory Visit: Payer: Self-pay

## 2021-08-04 ENCOUNTER — Encounter: Payer: Self-pay | Admitting: Family Medicine

## 2021-08-04 VITALS — BP 126/74 | HR 73 | Temp 98.4°F | Resp 16 | Ht 75.0 in | Wt 215.2 lb

## 2021-08-04 DIAGNOSIS — Z125 Encounter for screening for malignant neoplasm of prostate: Secondary | ICD-10-CM | POA: Diagnosis not present

## 2021-08-04 DIAGNOSIS — Z1283 Encounter for screening for malignant neoplasm of skin: Secondary | ICD-10-CM

## 2021-08-04 DIAGNOSIS — Z79899 Other long term (current) drug therapy: Secondary | ICD-10-CM

## 2021-08-04 DIAGNOSIS — E785 Hyperlipidemia, unspecified: Secondary | ICD-10-CM | POA: Diagnosis not present

## 2021-08-04 DIAGNOSIS — Z23 Encounter for immunization: Secondary | ICD-10-CM | POA: Diagnosis not present

## 2021-08-04 DIAGNOSIS — B354 Tinea corporis: Secondary | ICD-10-CM

## 2021-08-04 DIAGNOSIS — R739 Hyperglycemia, unspecified: Secondary | ICD-10-CM

## 2021-08-04 DIAGNOSIS — H9191 Unspecified hearing loss, right ear: Secondary | ICD-10-CM

## 2021-08-04 DIAGNOSIS — Z0000 Encounter for general adult medical examination without abnormal findings: Secondary | ICD-10-CM

## 2021-08-04 DIAGNOSIS — R21 Rash and other nonspecific skin eruption: Secondary | ICD-10-CM

## 2021-08-04 DIAGNOSIS — F325 Major depressive disorder, single episode, in full remission: Secondary | ICD-10-CM

## 2021-08-04 LAB — LIPID PANEL
Cholesterol: 199 mg/dL (ref 0–200)
HDL: 35.2 mg/dL — ABNORMAL LOW (ref >39.00)
LDL Cholesterol: 128 mg/dL — ABNORMAL HIGH (ref 0–99)
NonHDL: 163.51
Total CHOL/HDL Ratio: 6
Triglycerides: 178 mg/dL — ABNORMAL HIGH (ref 0.0–149.0)
VLDL: 35.6 mg/dL (ref 0.0–40.0)

## 2021-08-04 LAB — CBC WITH DIFFERENTIAL/PLATELET
Basophils Absolute: 0.1 10*3/uL (ref 0.0–0.1)
Basophils Relative: 0.9 % (ref 0.0–3.0)
Eosinophils Absolute: 0.2 10*3/uL (ref 0.0–0.7)
Eosinophils Relative: 2.5 % (ref 0.0–5.0)
HCT: 45.3 % (ref 39.0–52.0)
Hemoglobin: 15.1 g/dL (ref 13.0–17.0)
Lymphocytes Relative: 24.5 % (ref 12.0–46.0)
Lymphs Abs: 1.7 10*3/uL (ref 0.7–4.0)
MCHC: 33.3 g/dL (ref 30.0–36.0)
MCV: 92.8 fl (ref 78.0–100.0)
Monocytes Absolute: 0.6 10*3/uL (ref 0.1–1.0)
Monocytes Relative: 8.8 % (ref 3.0–12.0)
Neutro Abs: 4.3 10*3/uL (ref 1.4–7.7)
Neutrophils Relative %: 63.3 % (ref 43.0–77.0)
Platelets: 330 10*3/uL (ref 150.0–400.0)
RBC: 4.88 Mil/uL (ref 4.22–5.81)
RDW: 13 % (ref 11.5–15.5)
WBC: 6.8 10*3/uL (ref 4.0–10.5)

## 2021-08-04 LAB — PSA: PSA: 1.01 ng/mL (ref 0.10–4.00)

## 2021-08-04 LAB — VITAMIN B12: Vitamin B-12: 291 pg/mL (ref 211–911)

## 2021-08-04 LAB — HEMOGLOBIN A1C: Hgb A1c MFr Bld: 5.6 % (ref 4.6–6.5)

## 2021-08-04 LAB — COMPREHENSIVE METABOLIC PANEL
ALT: 19 U/L (ref 0–53)
AST: 17 U/L (ref 0–37)
Albumin: 4 g/dL (ref 3.5–5.2)
Alkaline Phosphatase: 65 U/L (ref 39–117)
BUN: 14 mg/dL (ref 6–23)
CO2: 26 mEq/L (ref 19–32)
Calcium: 9.5 mg/dL (ref 8.4–10.5)
Chloride: 104 mEq/L (ref 96–112)
Creatinine, Ser: 0.81 mg/dL (ref 0.40–1.50)
GFR: 96.05 mL/min (ref >60.00)
Glucose, Bld: 101 mg/dL — ABNORMAL HIGH (ref 70–99)
Potassium: 4.2 mEq/L (ref 3.5–5.1)
Sodium: 138 mEq/L (ref 135–145)
Total Bilirubin: 0.5 mg/dL (ref 0.2–1.2)
Total Protein: 6.9 g/dL (ref 6.0–8.3)

## 2021-08-04 MED ORDER — SILDENAFIL CITRATE 20 MG PO TABS: ORAL_TABLET | ORAL | 3 refills | Status: DC

## 2021-08-04 NOTE — Patient Instructions (Addendum)
Health Maintenance Due  Topic Date Due   INFLUENZA VACCINE   - today 06/23/2021    We will call you within two weeks about your referral to Dermatology. If you do not hear within 2 weeks, give [place] a call.    We will call you within two weeks about your referral to ENT. If you do not hear within 2 weeks, give [place] a call.    We will call you within two weeks about your referral to CT cardiac scoring. If you do not hear within 2 weeks, give [place] a call.   Please stop by lab before you go If you have mychart- we will send your results within 3 business days of [place] receiving them.  If you do not have mychart- we will call you about results within 5 business days of [place] receiving them.  *please also note that you will see labs on mychart as soon as they post. I will later go in and write notes on them- will say "notes from Dr. Durene Cal"    Recommended follow up: Return in about 1 year (around 08/04/2022) for physical or sooner if needed.Randie [employer] work exercising at [employer] and walking twice a day! Also with your dietary intake! Keep up the great work!

## 2021-08-04 NOTE — Addendum Note (Signed)
Addended by: Calton Golds A on: 08/04/2021 10:42 AM   Modules accepted: Orders

## 2021-08-08 ENCOUNTER — Telehealth: Payer: Self-pay

## 2021-08-08 NOTE — Telephone Encounter (Signed)
Left patient a VM requesting he call back to discuss his ENT referral since [facility] is out of network for him

## 2021-08-21 ENCOUNTER — Other Ambulatory Visit: Payer: Self-pay

## 2021-08-21 ENCOUNTER — Ambulatory Visit (INDEPENDENT_AMBULATORY_CARE_PROVIDER_SITE_OTHER)
Admission: RE | Admit: 2021-08-21 | Discharge: 2021-08-21 | Disposition: A | Discharge status: Home / Self Care | Payer: Self-pay | Source: Ambulatory Visit | Attending: Family Medicine | Admitting: Family Medicine

## 2021-08-21 ENCOUNTER — Encounter: Payer: Self-pay | Admitting: Family Medicine

## 2021-08-21 DIAGNOSIS — I700 Atherosclerosis of aorta: Secondary | ICD-10-CM | POA: Insufficient documentation

## 2021-08-21 DIAGNOSIS — E785 Hyperlipidemia, unspecified: Secondary | ICD-10-CM

## 2021-08-28 ENCOUNTER — Encounter: Payer: Self-pay | Admitting: Family Medicine

## 2021-09-04 ENCOUNTER — Other Ambulatory Visit: Payer: Self-pay

## 2021-09-04 MED ORDER — ROSUVASTATIN CALCIUM 5 MG PO TABS: 5.0000 mg | ORAL_TABLET | Freq: Every day | ORAL | 3 refills | Status: DC

## 2021-09-12 ENCOUNTER — Other Ambulatory Visit: Payer: Self-pay | Admitting: Family Medicine

## 2021-10-14 ENCOUNTER — Ambulatory Visit: Payer: Self-pay

## 2021-10-14 ENCOUNTER — Ambulatory Visit (INDEPENDENT_AMBULATORY_CARE_PROVIDER_SITE_OTHER): Payer: BC Managed Care – PPO | Admitting: Orthopaedic Surgery

## 2021-10-14 ENCOUNTER — Encounter: Payer: Self-pay | Admitting: Orthopaedic Surgery

## 2021-10-14 ENCOUNTER — Other Ambulatory Visit: Payer: Self-pay

## 2021-10-14 DIAGNOSIS — M7542 Impingement syndrome of left shoulder: Secondary | ICD-10-CM

## 2021-10-14 DIAGNOSIS — M7541 Impingement syndrome of right shoulder: Secondary | ICD-10-CM

## 2021-10-14 DIAGNOSIS — M542 Cervicalgia: Secondary | ICD-10-CM | POA: Diagnosis not present

## 2021-10-14 DIAGNOSIS — M25511 Pain in right shoulder: Secondary | ICD-10-CM

## 2021-10-14 MED ORDER — METHOCARBAMOL 500 MG PO TABS: 500.0000 mg | ORAL_TABLET | Freq: Four times a day (QID) | ORAL | 1 refills | Status: DC | PRN

## 2021-10-14 MED ORDER — METHYLPREDNISOLONE ACETATE 40 MG/ML IJ SUSP
40.0000 mg | INTRAMUSCULAR | Status: AC | PRN
  Administered 2021-10-14: 40 mg via INTRA_ARTICULAR

## 2021-10-14 MED ORDER — CELECOXIB 200 MG PO CAPS: 200.0000 mg | ORAL_CAPSULE | Freq: Two times a day (BID) | ORAL | 3 refills | Status: DC | PRN

## 2021-10-14 MED ORDER — LIDOCAINE HCL 1 % IJ SOLN
3.0000 mL | INTRAMUSCULAR | Status: AC | PRN
  Administered 2021-10-14: 3 mL

## 2021-10-14 MED ORDER — METHYLPREDNISOLONE ACETATE 40 MG/ML IJ SUSP
40.0000 mg | INTRAMUSCULAR | Status: AC | PRN
Start: 2021-10-14 — End: 2021-10-14
  Administered 2021-10-14: 40 mg via INTRA_ARTICULAR

## 2021-10-14 NOTE — Progress Notes (Signed)
Office Visit Note   Patient: Terry Mcpherson           Date of Birth: 1961-01-10           MRN: [id number] Visit Date: 10/14/2021              Requested by: Shelva Majestic, MD [address] [place],  [place] [address] PCP: Shelva Majestic, MD   Assessment & Plan: Visit Diagnoses:  1. Neck pain   2. Acute pain of right shoulder   3. Impingement syndrome of right shoulder   4. Impingement syndrome of left shoulder     Plan: Per the patient's request and per my recommendations I did place steroid injection in both shoulder subacromial outlets which he tolerated well.  I would like to send in to outpatient physical therapy for any modalities that can help with his neck and both shoulders per the therapist discretion.  I will send in prescriptions for both Celebrex and Robaxin.  All questions and concerns were answered and addressed.  We will see him back in 6 weeks to see how he is doing overall.  Follow-Up Instructions: Return in about 6 weeks (around 11/25/2021).   Orders:  Orders Placed This Encounter  Procedures   Large Joint Inj   Large Joint Inj   XR Shoulder Right   XR Cervical Spine 2 or 3 views   Meds ordered this encounter  Medications   celecoxib (CELEBREX) 200 MG capsule    Sig: Take 1 capsule (200 mg total) by mouth 2 (two) times daily between meals as needed.    Dispense:  60 capsule    Refill:  3   methocarbamol (ROBAXIN) 500 MG tablet    Sig: Take 1 tablet (500 mg total) by mouth every 6 (six) hours as needed.    Dispense:  40 tablet    Refill:  1      Procedures: Large Joint Inj: R subacromial bursa on 10/14/2021 9:36 AM Indications: pain and diagnostic evaluation Details: 22 G 1.5 in needle  Arthrogram: No  Medications: 3 mL lidocaine 1 %; 40 mg methylPREDNISolone acetate 40 MG/ML Outcome: tolerated well, no immediate complications Procedure, treatment alternatives, risks and benefits explained, specific risks discussed. Consent was given by  the patient. Immediately prior to procedure a time out was called to verify the correct patient, procedure, equipment, support staff and site/side marked as required. Patient was prepped and draped in the usual sterile fashion.    Large Joint Inj: L subacromial bursa on 10/14/2021 9:37 AM Indications: pain and diagnostic evaluation Details: 22 G 1.5 in needle  Arthrogram: No  Medications: 3 mL lidocaine 1 %; 40 mg methylPREDNISolone acetate 40 MG/ML Outcome: tolerated well, no immediate complications Procedure, treatment alternatives, risks and benefits explained, specific risks discussed. Consent was given by the patient. Immediately prior to procedure a time out was called to verify the correct patient, procedure, equipment, support staff and site/side marked as required. Patient was prepped and draped in the usual sterile fashion.      Clinical Data: No additional findings.   Subjective: Chief Complaint  Patient presents with   Right Shoulder - Pain   Neck - Pain  The patient comes in today for evaluation treatment of bilateral shoulder pain with the left shoulder being more of a chronic pain for many years now and the right shoulder only hurting for about 2 months.  He is an active 60 year old gentleman.  He denies any injury that  he is aware of.  He has tried massage therapy and ibuprofen.  It is causing him to not sleep well and is painful with certain movements overhead and reaching behind him.  He does report some numbness and tingling in the right arm and some neck pain as well.  He is not a diabetic.  HPI  Review of Systems There is currently listed no headache, chest pain, shortness of breath, fever, chills, nausea, vomiting  Objective: Vital Signs: There were no vitals taken for this visit.  Physical Exam He is alert and orient x3 and in no acute distress Ortho Exam Examination of both shoulder show the move fully and fluidly with more pain when he is reaching behind  him and across the front with positive Neer and Hawkins signs.  He does have some slight weakness with liftoff on the right side but not on the left side.  There is slight decreased motion when reaching behind him on the right side.  His abduction is strong on both shoulders.  His external rotation is strong on both shoulders.  His biceps and triceps function strong and his grip strength is strong on the right side.  There is some right-sided neck pain. Specialty Comments:  No specialty comments available.  Imaging: XR Cervical Spine 2 or 3 views  Result Date: 10/14/2021 2 views of the cervical spine show normal alignment.  There is some slight degenerative changes of the lower cervical spine.  XR Shoulder Right  Result Date: 10/14/2021 3 views of the right shoulder does show some arthritic changes of the glenohumeral joint.    PMFS History: Patient Active Problem List   Diagnosis Date Noted   Aortic atherosclerosis (HCC) 08/21/2021   BPH associated with nocturia 11/05/2017   Hearing loss of right ear 05/05/2017   Hyperglycemia 11/04/2016   Hyperlipidemia 11/04/2015   Erectile dysfunction 12/24/2014   History of alcohol abuse 12/24/2014   Allergic rhinitis 10/22/2010   Depression 10/07/2007   GERD 10/07/2007   Past Medical History:  Diagnosis Date   Allergy    Anxiety    Chronic sinusitis    Depression    GERD (gastroesophageal reflux disease)     Family History  Problem Relation Age of Onset   Depression Mother        sister, brother   Deep vein thrombosis Father        PE after fracture   Depression Father    Hyperlipidemia Father    Colon cancer Neg Hx     Past Surgical History:  Procedure Laterality Date   COLONOSCOPY     egd with dilatation  2001   Social History   Occupational History   Not on file  Tobacco Use   Smoking status: Never   Smokeless tobacco: Never  Substance and Sexual Activity   Alcohol use: No    Alcohol/week: 0.0 standard drinks    Drug use: No   Sexual activity: Not on file

## 2021-10-15 ENCOUNTER — Other Ambulatory Visit: Payer: Self-pay

## 2021-10-15 DIAGNOSIS — M7541 Impingement syndrome of right shoulder: Secondary | ICD-10-CM

## 2021-10-15 DIAGNOSIS — M7542 Impingement syndrome of left shoulder: Secondary | ICD-10-CM

## 2021-10-15 DIAGNOSIS — M542 Cervicalgia: Secondary | ICD-10-CM

## 2021-10-29 ENCOUNTER — Ambulatory Visit (INDEPENDENT_AMBULATORY_CARE_PROVIDER_SITE_OTHER): Payer: BC Managed Care – PPO | Admitting: Rehabilitative and Restorative Service Providers"

## 2021-10-29 ENCOUNTER — Encounter: Payer: Self-pay | Admitting: Rehabilitative and Restorative Service Providers"

## 2021-10-29 ENCOUNTER — Other Ambulatory Visit: Payer: Self-pay

## 2021-10-29 DIAGNOSIS — R600 Localized edema: Secondary | ICD-10-CM | POA: Diagnosis not present

## 2021-10-29 DIAGNOSIS — M6281 Muscle weakness (generalized): Secondary | ICD-10-CM | POA: Diagnosis not present

## 2021-10-29 DIAGNOSIS — M25612 Stiffness of left shoulder, not elsewhere classified: Secondary | ICD-10-CM

## 2021-10-29 DIAGNOSIS — R293 Abnormal posture: Secondary | ICD-10-CM

## 2021-10-29 DIAGNOSIS — M25511 Pain in right shoulder: Secondary | ICD-10-CM

## 2021-10-29 DIAGNOSIS — M25512 Pain in left shoulder: Secondary | ICD-10-CM

## 2021-10-29 DIAGNOSIS — G8929 Other chronic pain: Secondary | ICD-10-CM

## 2021-10-29 DIAGNOSIS — M25611 Stiffness of right shoulder, not elsewhere classified: Secondary | ICD-10-CM

## 2021-10-29 NOTE — Therapy (Signed)
[facility] [address] [place], [place], [address] Phone: [phone]   Fax:  [phone]  Physical Therapy Evaluation  Patient Details  Name: Terry Mcpherson MRN: [id number] Date of Birth: 1961/06/15 Referring Provider (PT): Kathryne Hitch MD   Encounter Date: 10/29/2021   PT End of Session - 10/29/21 1726     Visit Number 1    Number of Visits 9    Date for PT Re-Evaluation 12/10/21    Progress Note Due on Visit 9    PT Start Time 1345    PT Stop Time 1430    PT Time Calculation (min) 45 min    Activity Tolerance Patient tolerated treatment well    Behavior During Therapy [facility] for tasks assessed/performed             Past Medical History:  Diagnosis Date   Allergy    Anxiety    Chronic sinusitis    Depression    GERD (gastroesophageal reflux disease)     Past Surgical History:  Procedure Laterality Date   COLONOSCOPY     egd with dilatation  2001    There were no vitals filed for this visit.    Subjective Assessment - 10/29/21 1721     Subjective Brantley has a long history of L shoulder pain.  His R shoulder started bothering him ~ 6 weeks ago.  B shoulder cortisone injections helped but he would like a long-term fix.    Pertinent History NA    Limitations Lifting;House hold activities    Patient Stated Goals Be able to return to the gym without B shoulder pain    Currently in Pain? Yes    Pain Score 3     Pain Location Shoulder    Pain Orientation Right    Pain Descriptors / Indicators Tightness;Sore    Pain Type Acute pain    Pain Radiating Towards NA    Pain Onset More than a month ago    Pain Frequency Intermittent    Aggravating Factors  Impingement type postures (reaching behind the back, lifting the elbow above the shoulder and hand, overhead function)    Pain Relieving Factors Cortisone helped    Effect of Pain on Daily Activities Not returned to the gym yet    Multiple Pain Sites No                 [facility] PT Assessment - 10/29/21 0001       Assessment   Medical Diagnosis Neck pain and B shoulder impingement    Referring Provider (PT) Kathryne Hitch MD    Onset Date/Surgical Date --   L chronic, R > a month     Precautions   Precautions None      Restrictions   Weight Bearing Restrictions No      Balance Screen   Has the patient fallen in the past 6 months No    Has the patient had a decrease in activity level because of a fear of falling?  No    Is the patient reluctant to leave their home because of a fear of falling?  No      Home [employer] information [employer] residence      Prior Function   Level of Independence Independent    Leisure Gym      Cognition   Overall Cognitive Status Within Functional Limits for tasks assessed      Observation/Other Assessments   Focus on  Therapeutic Outcomes (FOTO)  51 (Goal 74 in 9 visits)      ROM / Strength   AROM / PROM / Strength AROM;Strength      AROM   Overall AROM  Deficits    AROM Assessment Site Cervical;Shoulder    Right/Left Shoulder Left;Right    Right Shoulder Flexion 150 Degrees    Right Shoulder Internal Rotation 20 Degrees    Right Shoulder External Rotation 100 Degrees    Right Shoulder Horizontal  ADduction 40 Degrees    Left Shoulder Flexion 160 Degrees    Left Shoulder Internal Rotation 40 Degrees    Left Shoulder External Rotation 80 Degrees    Left Shoulder Horizontal ADduction 40 Degrees    Cervical Extension 70    Cervical - Right Rotation 60    Cervical - Left Rotation 60      Strength   Overall Strength Deficits    Strength Assessment Site Cervical;Shoulder    Right/Left Shoulder Left;Right    Right Shoulder Internal Rotation --   45.8 pounds   Right Shoulder External Rotation --   23.2 pounds   Left Shoulder Internal Rotation --   38.6 pounds   Left Shoulder External Rotation --   28.4 pounds   Cervical Extension --   30.9 pounds   Cervical - Right Side Bend --    21.3 pounds   Cervical - Left Side Bend --   21.9 pounds                       Objective measurements completed on examination: See above findings.       [facility] Adult PT Treatment/Exercise - 10/29/21 0001       Therapeutic Activites    Therapeutic Activities Other Therapeutic Activities    Other Therapeutic Activities Reviewed shoulder anatomy, mechanism of impingement, exam findings and reviewed POC/HEP      Exercises   Exercises Shoulder      Shoulder Exercises: Supine   Other Supine Exercises Supine shoulder IR stretch (70 degrees abduction) 10X 10 seconds      Shoulder Exercises: Seated   Other Seated Exercises Cervical Extension Isometrics 10X 5 seconds      Shoulder Exercises: Standing   Other Standing Exercises Shoulder blade pinches 10X 5 seconds                     PT Education - 10/29/21 1724     Education Details Reviewed neck and shoulder anatomy with Gabino.  Talked about exam findings and our plan with PT.  Started 3 exercises to address impairments noted at evaluation.    Person(s) Educated Patient    Methods Explanation;Handout;Demonstration;Tactile cues;Verbal cues    Comprehension Verbalized understanding;Tactile cues required;Need further instruction;Returned demonstration;Verbal cues required              PT Short Term Goals - 10/29/21 1730       PT SHORT TERM GOAL #1   Title Improve B shoulder IR AROM to 50 degrees.    Baseline 20-40 degrees    Time 3    Period Weeks    Status New    Target Date 11/19/21               PT Long Term Goals - 10/29/21 1730       PT LONG TERM GOAL #1   Title Improve FOTO to 74 in 9 visits or less.    Baseline 51    Time  6    Period Weeks    Status New    Target Date 12/10/21      PT LONG TERM GOAL #2   Title Improve neck and B shoulder pain to consistently 0-2/10 on the Numeric Pain Rating Scale.    Baseline Was 5/10    Time 6    Period Weeks    Status New    Target  Date 12/10/21      PT LONG TERM GOAL #3   Title Improve B shoulder flexion AROM to 170; IR to 60 degrees; ER to 90 degrees and horizontal adduction to 40 degrees.    Baseline 150-160; 20-40; 80-100; 40 respectively    Time 6    Period Weeks    Status New    Target Date 12/10/21      PT LONG TERM GOAL #4   Title Improve R shoulder ER:IR strength ratio to at least 2:3.    Baseline 1:2    Time 6    Period Weeks    Status New    Target Date 12/10/21      PT LONG TERM GOAL #5   Title Zacharey will be independent with his long-term HEP at DC.    Baseline Started today    Time 6    Period Weeks    Status New    Target Date 12/10/21                    Plan - 10/29/21 1726     Clinical Impression Statement Braxlee has B shoulder impingement and cervical strain likely due to postural weakness.  B shoulders have limited IR AROM (R tighter than L).  He also has shoulder weakness (particularly R ER).  Cervical extensor and scapular weakness is also noted and will be addressed.  His prognosis is good to meet long-term goals with supervised PT.    Examination-Activity Limitations Reach Overhead;Dressing;Sleep    Examination-Participation Restrictions Community Activity    Stability/Clinical Decision Making Stable/Uncomplicated    Clinical Decision Making Moderate    Rehab Potential Good    PT Frequency Other (comment)   1-2X/week   PT Duration 6 weeks    PT Treatment/Interventions ADLs/Self Care Home Management;Cryotherapy;Therapeutic activities;Neuromuscular re-education;Therapeutic exercise;Patient/family education;Manual techniques    PT Next Visit Plan Review day 1 HEP.  Progress scapular and RTC strength.  Stretch into IR.    PT Home Exercise Plan Access Code: [email or web address]    Consulted and Agree with Plan of Care Patient             Patient will benefit from skilled therapeutic intervention in order to improve the following deficits and impairments:  Decreased activity  tolerance, Decreased endurance, Decreased range of motion, Decreased strength, Increased edema, Impaired perceived functional ability, Impaired UE functional use, Postural dysfunction, Pain  Visit Diagnosis: Abnormal posture  Muscle weakness (generalized)  Localized edema  Stiffness of left shoulder, not elsewhere classified  Stiffness of right shoulder, not elsewhere classified  Chronic left shoulder pain  Acute pain of right shoulder     Problem List Patient Active Problem List   Diagnosis Date Noted   Aortic atherosclerosis (HCC) 08/21/2021   BPH associated with nocturia 11/05/2017   Hearing loss of right ear 05/05/2017   Hyperglycemia 11/04/2016   Hyperlipidemia 11/04/2015   Erectile dysfunction 12/24/2014   History of alcohol abuse 12/24/2014   Allergic rhinitis 10/22/2010   Depression 10/07/2007   GERD 10/07/2007    Cherlyn Cushing, PT, MPT  10/29/2021, 5:35 PM  [facility] [address] [place], [place], [address] Phone: [phone]   Fax:  [phone]  Name: DEWAUN GILLION MRN: [id number] Date of Birth: 1961-02-19

## 2021-10-29 NOTE — Patient Instructions (Signed)
  Access Code: [email or web address] URL: [email or web address] Date: 10/29/2021 Prepared by: Pauletta Browns  Exercises Supine Shoulder Internal Rotation Stretch - 2-3 x daily - 7 x weekly - 1 sets - 10-20 reps - 10 seconds hold Standing Scapular Retraction - 5 x daily - 7 x weekly - 1 sets - 5 reps - 5 second hold Standing Isometric Cervical Extension with Manual Resistance - 5 x daily - 7 x weekly - 1 sets - 5 reps - 5 hold

## 2021-10-31 ENCOUNTER — Encounter: Payer: Self-pay | Admitting: Rehabilitative and Restorative Service Providers"

## 2021-10-31 ENCOUNTER — Ambulatory Visit: Payer: BC Managed Care – PPO | Admitting: Rehabilitative and Restorative Service Providers"

## 2021-10-31 ENCOUNTER — Other Ambulatory Visit: Payer: Self-pay

## 2021-10-31 DIAGNOSIS — R600 Localized edema: Secondary | ICD-10-CM

## 2021-10-31 DIAGNOSIS — M25611 Stiffness of right shoulder, not elsewhere classified: Secondary | ICD-10-CM

## 2021-10-31 DIAGNOSIS — M6281 Muscle weakness (generalized): Secondary | ICD-10-CM | POA: Diagnosis not present

## 2021-10-31 DIAGNOSIS — M25612 Stiffness of left shoulder, not elsewhere classified: Secondary | ICD-10-CM | POA: Diagnosis not present

## 2021-10-31 DIAGNOSIS — M25512 Pain in left shoulder: Secondary | ICD-10-CM

## 2021-10-31 DIAGNOSIS — M25511 Pain in right shoulder: Secondary | ICD-10-CM

## 2021-10-31 DIAGNOSIS — R293 Abnormal posture: Secondary | ICD-10-CM

## 2021-10-31 DIAGNOSIS — G8929 Other chronic pain: Secondary | ICD-10-CM

## 2021-10-31 NOTE — Patient Instructions (Signed)
Access Code: [email or web address] URL: [email or web address] Date: 10/31/2021 Prepared by: Pauletta Browns  Exercises Supine Shoulder Internal Rotation Stretch - 2-3 x daily - 7 x weekly - 1 sets - 10-20 reps - 10 seconds hold Standing Scapular Retraction - 5 x daily - 7 x weekly - 1 sets - 5 reps - 5 second hold Standing Isometric Cervical Extension with Manual Resistance - 5 x daily - 7 x weekly - 1 sets - 5 reps - 5 hold Standing Shoulder Internal Rotation Stretch with Hands Behind Back - 2-3 x daily - 7 x weekly - 1 sets - 10 reps - 10 seconds hold Shoulder External Rotation with Anchored Resistance with Towel Under Elbow - 1 x daily - 7 x weekly - 2-3 sets - 10 reps - 3 hold

## 2021-10-31 NOTE — Therapy (Signed)
[facility] [address] [place], [place], [address] Phone: [phone]   Fax:  [phone]  Physical Therapy Treatment  Patient Details  Name: Terry Mcpherson MRN: [id number] Date of Birth: 1961-02-28 Referring Provider (PT): Kathryne Hitch MD   Encounter Date: 10/31/2021   PT End of Session - 10/31/21 1631     Visit Number 2    Number of Visits 9    Date for PT Re-Evaluation 12/10/21    Progress Note Due on Visit 9    PT Start Time 1438    PT Stop Time 1516    PT Time Calculation (min) 38 min    Activity Tolerance Patient tolerated treatment well    Behavior During Therapy [facility] for tasks assessed/performed             Past Medical History:  Diagnosis Date   Allergy    Anxiety    Chronic sinusitis    Depression    GERD (gastroesophageal reflux disease)     Past Surgical History:  Procedure Laterality Date   COLONOSCOPY     egd with dilatation  2001    There were no vitals filed for this visit.   Subjective Assessment - 10/31/21 1628     Subjective Terry Mcpherson reports good early HEP compliance.    Pertinent History NA    Limitations Lifting;House hold activities    Patient Stated Goals Be able to return to the gym without B shoulder pain    Currently in Pain? Yes    Pain Score 3     Pain Location Shoulder    Pain Orientation Right    Pain Descriptors / Indicators Sore;Tightness    Pain Type Acute pain    Pain Radiating Towards NA    Pain Onset More than a month ago    Pain Frequency Intermittent    Aggravating Factors  Impingement postures including reaching above shoulder height and elbow above the shoulder and hand positions    Pain Relieving Factors Relief with a cortisone shot    Effect of Pain on Daily Activities Not yet back to the gym    Multiple Pain Sites No                               [facility] Adult PT Treatment/Exercise - 10/31/21 0001       Therapeutic Activites    Therapeutic  Activities Other Therapeutic Activities    Other Therapeutic Activities Reviewed shoulder anatomy, mechanism of impingement, exam findings and reviewed POC/HEP      Exercises   Exercises Shoulder      Shoulder Exercises: Supine   Other Supine Exercises Supine shoulder IR stretch (70 degrees abduction) 25X 10 seconds      Shoulder Exercises: Seated   Other Seated Exercises Cervical Extension Isometrics 10X 5 seconds      Shoulder Exercises: Standing   External Rotation Strengthening;Both;10 reps;Theraband;Limitations    Theraband Level (Shoulder External Rotation) Level 3 (Green)    External Rotation Limitations 2 sets with 3 second hold and slow eccentrics    Other Standing Exercises Shoulder blade pinches 10X 5 seconds    Other Standing Exercises Thumb up the back stretch 10X 10 seconds (each)                     PT Education - 10/31/21 1630     Education Details Reviewed HEP with corrections to IR stretch.  Added another shoulder IR stretch and ER strengthening.    Person(s) Educated Patient    Methods Explanation;Handout;Demonstration;Tactile cues;Verbal cues    Comprehension Verbal cues required;Need further instruction;Returned demonstration;Verbalized understanding;Tactile cues required              PT Short Term Goals - 10/29/21 1730       PT SHORT TERM GOAL #1   Title Improve B shoulder IR AROM to 50 degrees.    Baseline 20-40 degrees    Time 3    Period Weeks    Status New    Target Date 11/19/21               PT Long Term Goals - 10/29/21 1730       PT LONG TERM GOAL #1   Title Improve FOTO to 74 in 9 visits or less.    Baseline 51    Time 6    Period Weeks    Status New    Target Date 12/10/21      PT LONG TERM GOAL #2   Title Improve neck and B shoulder pain to consistently 0-2/10 on the Numeric Pain Rating Scale.    Baseline Was 5/10    Time 6    Period Weeks    Status New    Target Date 12/10/21      PT LONG TERM GOAL #3    Title Improve B shoulder flexion AROM to 170; IR to 60 degrees; ER to 90 degrees and horizontal adduction to 40 degrees.    Baseline 150-160; 20-40; 80-100; 40 respectively    Time 6    Period Weeks    Status New    Target Date 12/10/21      PT LONG TERM GOAL #4   Title Improve R shoulder ER:IR strength ratio to at least 2:3.    Baseline 1:2    Time 6    Period Weeks    Status New    Target Date 12/10/21      PT LONG TERM GOAL #5   Title Terry Mcpherson will be independent with his long-term HEP at [place].    Baseline Started today    Time 6    Period Weeks    Status New    Target Date 12/10/21                   Plan - 10/31/21 1631     Clinical Impression Statement Terry Mcpherson did a good job with his early HEP emphasizing posterior capsule flexibility, scapular/postural strength and cervical strength.  Corrections were made and progressions to his HEP given.  Continued scapular and RTC strength work will complement postural correction and his current program to meet LTGs.    Examination-Activity Limitations Reach Overhead;Dressing;Sleep    Examination-Participation Restrictions Community Activity    Stability/Clinical Decision Making Stable/Uncomplicated    Rehab Potential Good    PT Frequency Other (comment)   1-2X/week   PT Duration 6 weeks    PT Treatment/Interventions ADLs/Self Care Home Management;Cryotherapy;Therapeutic activities;Neuromuscular re-education;Therapeutic exercise;Patient/family education;Manual techniques    PT Next Visit Plan Progress scapular and RTC strength.  Stretch into IR.  Postural strength progressions when shoulders allow.    PT Home Exercise Plan Access Code: [email or web address]    Consulted and Agree with Plan of Care Patient             Patient will benefit from skilled therapeutic intervention in order to improve the following deficits and impairments:  Decreased activity tolerance,  Decreased endurance, Decreased range of motion, Decreased strength,  Increased edema, Impaired perceived functional ability, Impaired UE functional use, Postural dysfunction, Pain  Visit Diagnosis: Abnormal posture  Muscle weakness (generalized)  Localized edema  Stiffness of left shoulder, not elsewhere classified  Stiffness of right shoulder, not elsewhere classified  Chronic left shoulder pain  Acute pain of right shoulder     Problem List Patient Active Problem List   Diagnosis Date Noted   Aortic atherosclerosis (HCC) 08/21/2021   BPH associated with nocturia 11/05/2017   Hearing loss of right ear 05/05/2017   Hyperglycemia 11/04/2016   Hyperlipidemia 11/04/2015   Erectile dysfunction 12/24/2014   History of alcohol abuse 12/24/2014   Allergic rhinitis 10/22/2010   Depression 10/07/2007   GERD 10/07/2007    Cherlyn Cushing, PT, MPT 10/31/2021, 4:36 PM  [facility] [address] [place], [place], [address] Phone: [phone]   Fax:  [phone]  Name: Terry Mcpherson MRN: [id number] Date of Birth: 11-16-1961

## 2021-11-14 ENCOUNTER — Encounter: Payer: BC Managed Care – PPO | Admitting: Rehabilitative and Restorative Service Providers"

## 2021-11-20 ENCOUNTER — Other Ambulatory Visit: Payer: Self-pay

## 2021-11-20 ENCOUNTER — Ambulatory Visit (INDEPENDENT_AMBULATORY_CARE_PROVIDER_SITE_OTHER): Payer: BC Managed Care – PPO | Admitting: Rehabilitative and Restorative Service Providers"

## 2021-11-20 ENCOUNTER — Encounter: Payer: Self-pay | Admitting: Rehabilitative and Restorative Service Providers"

## 2021-11-20 DIAGNOSIS — M25511 Pain in right shoulder: Secondary | ICD-10-CM

## 2021-11-20 DIAGNOSIS — M25611 Stiffness of right shoulder, not elsewhere classified: Secondary | ICD-10-CM

## 2021-11-20 DIAGNOSIS — R293 Abnormal posture: Secondary | ICD-10-CM

## 2021-11-20 DIAGNOSIS — R600 Localized edema: Secondary | ICD-10-CM

## 2021-11-20 DIAGNOSIS — M6281 Muscle weakness (generalized): Secondary | ICD-10-CM | POA: Diagnosis not present

## 2021-11-20 NOTE — Therapy (Signed)
[facility] [address] [place], [place], [address] Phone: [phone]   Fax:  [phone]  Physical Therapy Treatment/Progress Note  Patient Details  Name: Terry Mcpherson MRN: [id number] Date of Birth: 1961/05/24 Referring Provider (PT): Mcarthur Rossetti MD  Progress Note Reporting Period 10/29/2021 to 11/20/2021  See note below for Objective Data and Assessment of Progress/Goals.     Encounter Date: 11/20/2021   PT End of Session - 11/20/21 1331     Visit Number 3    Number of Visits 9    Date for PT Re-Evaluation 12/10/21    Progress Note Due on Visit 9    PT Start Time 1010    PT Stop Time 1057    PT Time Calculation (min) 47 min    Activity Tolerance Patient tolerated treatment well    Behavior During Therapy WFL for tasks assessed/performed             Past Medical History:  Diagnosis Date   Allergy    Anxiety    Chronic sinusitis    Depression    GERD (gastroesophageal reflux disease)     Past Surgical History:  Procedure Laterality Date   COLONOSCOPY     egd with dilatation  2001    There were no vitals filed for this visit.   Subjective Assessment - 11/20/21 1017     Subjective Terry Mcpherson reports he has not been as good with his HEP over the past 1-2 weeks.  Neck pain is much better.    Pertinent History NA    Limitations Lifting;House hold activities    Patient Stated Goals Be able to return to the gym without B shoulder pain    Currently in Pain? Yes    Pain Score 3     Pain Location Shoulder    Pain Orientation Right    Pain Descriptors / Indicators Tightness;Sore    Pain Type Acute pain    Pain Radiating Towards NA    Pain Onset More than a month ago    Pain Frequency Intermittent    Aggravating Factors  Reaching above shoulder height and behind the back    Pain Relieving Factors NA    Effect of Pain on Daily Activities Limited with R UE function    Multiple Pain Sites No                 [facility] PT Assessment - 11/20/21 0001       Observation/Other Assessments   Focus on Therapeutic Outcomes (FOTO)  75 (was 51, Goal 74)      ROM / Strength   AROM / PROM / Strength AROM;Strength      AROM   Overall AROM  Deficits    AROM Assessment Site Shoulder    Right/Left Shoulder Left;Right    Right Shoulder Flexion 160 Degrees    Right Shoulder Internal Rotation 35 Degrees    Right Shoulder External Rotation 95 Degrees    Right Shoulder Horizontal  ADduction 30 Degrees      Strength   Right Shoulder Internal Rotation --   35.6 pounds   Right Shoulder External Rotation --   18.9 pounds   Cervical Extension --   44.7 pounds (was 30.9)   Cervical - Right Side Bend --   30.1 pounds (was 21.3)   Cervical - Left Side Bend --   27.1 pounds (was 21.9 pounds)  [place] [facility] PT Treatment/Exercise - 11/20/21 0001       Exercises   Exercises Shoulder      Shoulder Exercises: Supine   Other Supine Exercises Supine shoulder IR stretch (70 degrees abduction) 25X 10 seconds      Shoulder Exercises: Seated   Other Seated Exercises Cervical Extension Isometrics 10X 5 seconds    Other Seated Exercises Posterior capsule stretch 5X 10 seconds      Shoulder Exercises: Standing   External Rotation Strengthening;Both;10 reps;Theraband;Limitations    Theraband Level (Shoulder External Rotation) Level 3 (Green)    External Rotation Limitations 2 sets with 3 second hold and slow eccentrics    Other Standing Exercises Shoulder blade pinches 10X 5 seconds    Other Standing Exercises Thumb up the back stretch 10X 10 seconds (each)                     PT Education - 11/20/21 1327     Education Details Reviewed HEP with corrections to IR stretching needed.  Added posterior capsule stretch and modified HEP.    Person(s) Educated Patient    Methods Explanation;Demonstration;Tactile cues;Verbal cues;Handout    Comprehension Verbalized  understanding;Tactile cues required;Need further instruction;Returned demonstration;Verbal cues required              PT Short Term Goals - 11/20/21 1329       PT SHORT TERM GOAL #1   Title Improve B shoulder IR AROM to 50 degrees.    Baseline 35-40 degrees (was 20-40 degrees)    Time 3    Period Weeks    Status On-going    Target Date 11/19/21               PT Long Term Goals - 11/20/21 1329       PT LONG TERM GOAL #1   Title Improve FOTO to 74 in 9 visits or less.    Baseline 75 (was 51)    Time 6    Period Weeks    Status Achieved    Target Date 12/10/21      PT LONG TERM GOAL #2   Title Improve neck and B shoulder pain to consistently 0-2/10 on the Numeric Pain Rating Scale.    Baseline Neck 0/10, shoulder can still be 5/10 (both were 5/10)    Time 6    Period Weeks    Status Partially Met    Target Date 12/10/21      PT LONG TERM GOAL #3   Title Improve B shoulder flexion AROM to 170; IR to 60 degrees; ER to 90 degrees and horizontal adduction to 40 degrees.    Baseline R shoulder is improving (see objective) but IR in particular is still limited.    Time 6    Period Weeks    Status Partially Met    Target Date 12/10/21      PT LONG TERM GOAL #4   Title Improve R shoulder ER:IR strength ratio to at least 2:3.    Baseline 1:2    Time 6    Period Weeks    Status On-going    Target Date 12/10/21      PT LONG TERM GOAL #5   Title Terry Mcpherson will be independent with his long-term HEP at DC.    Baseline Started today    Time 6    Period Weeks    Status On-going    Target Date 12/10/21  Plan - 11/20/21 1331     Clinical Impression Statement Terry Mcpherson notes significant progress with his neck pain since starting PT.  His shoulder is moving better but remains very tight with impingement still a problem.  Improved HEP compliance will assist faster progress.  I modified Terry Mcpherson's program today to make compliance easier and avoid  overuse.  RA again in 3-4 weeks.    Examination-Activity Limitations Reach Overhead;Dressing;Sleep    Examination-Participation Restrictions Community Activity    Stability/Clinical Decision Making Stable/Uncomplicated    Rehab Potential Good    PT Frequency Other (comment)   1-2X/week   PT Duration 4 weeks    PT Treatment/Interventions ADLs/Self Care Home Management;Cryotherapy;Therapeutic activities;Neuromuscular re-education;Therapeutic exercise;Patient/family education;Manual techniques    PT Next Visit Plan R shoulder AROM and strength (IR stretching emphasis)    PT Home Exercise Plan Access Code: [email or web address]    Consulted and Agree with Plan of Care Patient             Patient will benefit from skilled therapeutic intervention in order to improve the following deficits and impairments:  Decreased activity tolerance, Decreased endurance, Decreased range of motion, Decreased strength, Increased edema, Impaired perceived functional ability, Impaired UE functional use, Postural dysfunction, Pain  Visit Diagnosis: Abnormal posture  Muscle weakness (generalized)  Localized edema  Stiffness of right shoulder, not elsewhere classified  Acute pain of right shoulder     Problem List Patient Active Problem List   Diagnosis Date Noted   Aortic atherosclerosis ([place]) 08/21/2021   BPH associated with nocturia 11/05/2017   Hearing loss of right ear 05/05/2017   Hyperglycemia 11/04/2016   Hyperlipidemia 11/04/2015   Erectile dysfunction 12/24/2014   History of alcohol abuse 12/24/2014   Allergic rhinitis 10/22/2010   Depression 10/07/2007   GERD 10/07/2007    Farley Ly, PT, MPT 11/20/2021, 1:34 PM  [facility] [address] [place], [place], [address] Phone: [phone]   Fax:  [phone]  Name: CLEMONS SALVUCCI MRN: [id number] Date of Birth: Mar 30, 1961

## 2021-11-20 NOTE — Patient Instructions (Signed)
Access Code: [email or web address] URL: [email or web address] Date: 11/20/2021 Prepared by: Pauletta Browns  Exercises Supine Shoulder Internal Rotation Stretch - 2 x daily - 7 x weekly - 1 sets - 20-25 reps - 10 seconds hold Standing Scapular Retraction - 5 x daily - 7 x weekly - 1 sets - 5 reps - 5 second hold Standing Isometric Cervical Extension with Manual Resistance - 3 x daily - 7 x weekly - 1 sets - 5 reps - 5 hold Standing Shoulder Internal Rotation Stretch with Hands Behind Back - 2-3 x daily - 7 x weekly - 1 sets - 10 reps - 10 seconds hold Shoulder External Rotation with Anchored Resistance with Towel Under Elbow - 1 x daily - 3 x weekly - 2-3 sets - 10 reps - 3 hold Standing Shoulder Posterior Capsule Stretch - 2 x daily - 7 x weekly - 1 sets - 5 reps - 10 hold

## 2021-12-05 ENCOUNTER — Ambulatory Visit (INDEPENDENT_AMBULATORY_CARE_PROVIDER_SITE_OTHER): Payer: BC Managed Care – PPO | Admitting: Rehabilitative and Restorative Service Providers"

## 2021-12-05 ENCOUNTER — Other Ambulatory Visit: Payer: Self-pay

## 2021-12-05 ENCOUNTER — Encounter: Payer: Self-pay | Admitting: Rehabilitative and Restorative Service Providers"

## 2021-12-05 DIAGNOSIS — R600 Localized edema: Secondary | ICD-10-CM

## 2021-12-05 DIAGNOSIS — R293 Abnormal posture: Secondary | ICD-10-CM | POA: Diagnosis not present

## 2021-12-05 DIAGNOSIS — M25611 Stiffness of right shoulder, not elsewhere classified: Secondary | ICD-10-CM | POA: Diagnosis not present

## 2021-12-05 DIAGNOSIS — M6281 Muscle weakness (generalized): Secondary | ICD-10-CM

## 2021-12-05 DIAGNOSIS — M25511 Pain in right shoulder: Secondary | ICD-10-CM

## 2021-12-05 NOTE — Patient Instructions (Signed)
Access Code: [email or web address] URL: [email or web address] Date: 12/05/2021 Prepared by: Pauletta Browns  Exercises Supine Shoulder Internal Rotation Stretch - 2 x daily - 7 x weekly - 1 sets - 20-25 reps - 10 seconds hold Standing Scapular Retraction - 5 x daily - 7 x weekly - 1 sets - 5 reps - 5 second hold Standing Isometric Cervical Extension with Manual Resistance - 1 x daily - 7 x weekly - 1 sets - 5 reps - 5 hold Standing Shoulder Internal Rotation Stretch with Hands Behind Back - 2 x daily - 7 x weekly - 1 sets - 10 reps - 10 seconds hold Shoulder External Rotation with Anchored Resistance with Towel Under Elbow - 1 x daily - 3 x weekly - 2-3 sets - 10 reps - 3 hold Standing Shoulder Posterior Capsule Stretch - 2 x daily - 7 x weekly - 1 sets - 5 reps - 10 hold

## 2021-12-05 NOTE — Therapy (Signed)
[facility] Physical [facility] [address] [place], [place], [address] Phone: [phone]   Fax:  [phone]  Physical Therapy Treatment  Patient Details  Name: Terry Mcpherson MRN: [id number] Date of Birth: [birth year] Referring Provider (PT): Mcarthur Rossetti MD   Encounter Date: 12/05/2021   PT End of Session - 12/05/21 1531     Visit Number 4    Number of Visits 9    Date for PT Re-Evaluation 12/10/21    Progress Note Due on Visit 9    PT Start Time 1341    PT Stop Time 1421    PT Time Calculation (min) 40 min    Activity Tolerance Patient tolerated treatment well    Behavior During Therapy [facility] for tasks assessed/performed             Past Medical History:  Diagnosis Date   Allergy    Anxiety    Chronic sinusitis    Depression    GERD (gastroesophageal reflux disease)     Past Surgical History:  Procedure Laterality Date   COLONOSCOPY     egd with dilatation  2001    There were no vitals filed for this visit.   Subjective Assessment - 12/05/21 1423     Subjective [name] reports good HEP compliance since his last PT visit.  His neck is not bothering him while his shoulder still limits function.    Pertinent History NA    Limitations Lifting;House hold activities    Patient Stated Goals Be able to return to the gym without B shoulder pain    Currently in Pain? Yes    Pain Score 3     Pain Location Shoulder    Pain Orientation Right    Pain Descriptors / Indicators Sore;Aching    Pain Type Acute pain    Pain Radiating Towards NA    Pain Onset More than a month ago    Pain Frequency Intermittent    Aggravating Factors  Overhead and behind the back function.  Sleeping on the R side.    Pain Relieving Factors NA    Effect of Pain on Daily Activities Limits sleep and R UE function    Multiple Pain Sites No                [facility] PT Assessment - 12/05/21 0001       ROM / Strength   AROM / PROM / Strength AROM;Strength       AROM   Overall AROM  Deficits    AROM Assessment Site Shoulder    Right/Left Shoulder Right    Right Shoulder Flexion 160 Degrees    Right Shoulder Internal Rotation 55 Degrees    Right Shoulder External Rotation 95 Degrees    Right Shoulder Horizontal  ADduction 35 Degrees      Strength   Overall Strength Deficits    Strength Assessment Site Shoulder    Right/Left Shoulder Right    Right Shoulder Internal Rotation --   45.9 pounds   Right Shoulder External Rotation --   25.6 pounds                          [facility] Adult PT Treatment/Exercise - 12/05/21 0001       Exercises   Exercises Shoulder      Shoulder Exercises: Supine   Other Supine Exercises Supine shoulder IR stretch (70 degrees abduction) 25X 10 seconds      Shoulder  Exercises: Seated   Other Seated Exercises Cervical Extension Isometrics 10X 5 seconds    Other Seated Exercises Posterior capsule stretch 10X 10 seconds      Shoulder Exercises: Standing   External Rotation Strengthening;Both;10 reps;Theraband;Limitations    Theraband Level (Shoulder External Rotation) Level 3 (Green)    External Rotation Limitations 2 sets with 3 second hold and slow eccentrics    Other Standing Exercises Shoulder blade pinches 10X 5 seconds    Other Standing Exercises Thumb up the back stretch 10X 10 seconds (each)                     PT Education - 12/05/21 1530     Education Details Reviewed HEP with special emphasis on how often different activities are done.  Increased attention to ER strengthening.    Person(s) Educated Patient    Methods Explanation;Demonstration;Tactile cues;Verbal cues;Handout    Comprehension Verbalized understanding;Tactile cues required;Need further instruction;Returned demonstration;Verbal cues required              PT Short Term Goals - 12/05/21 1531       PT SHORT TERM GOAL #1   Title Improve B shoulder IR AROM to 50 degrees.    Baseline 55 degrees R (was 20  degrees)    Time 3    Period Weeks    Status Achieved    Target Date 11/19/21               PT Long Term Goals - 11/20/21 1329       PT LONG TERM GOAL #1   Title Improve FOTO to 74 in 9 visits or less.    Baseline 75 (was 51)    Time 6    Period Weeks    Status Achieved    Target Date 12/10/21      PT LONG TERM GOAL #2   Title Improve neck and B shoulder pain to consistently 0-2/10 on the Numeric Pain Rating Scale.    Baseline Neck 0/10, shoulder can still be 5/10 (both were 5/10)    Time 6    Period Weeks    Status Partially Met    Target Date 12/10/21      PT LONG TERM GOAL #3   Title Improve B shoulder flexion AROM to 170; IR to 60 degrees; ER to 90 degrees and horizontal adduction to 40 degrees.    Baseline R shoulder is improving (see objective) but IR in particular is still limited.    Time 6    Period Weeks    Status Partially Met    Target Date 12/10/21      PT LONG TERM GOAL #4   Title Improve R shoulder ER:IR strength ratio to at least 2:3.    Baseline 1:2    Time 6    Period Weeks    Status On-going    Target Date 12/10/21      PT LONG TERM GOAL #5   Title Elaine will be independent with his long-term HEP at DC.    Baseline Started today    Time 6    Period Weeks    Status On-going    Target Date 12/10/21                   Plan - 12/05/21 1532     Clinical Impression Statement Terry Mcpherson now has very little neck pain.  His shoulder is moving better, although pain, strength and self-reported function are impaired.  Orien admits he didn't do his shoulder strength as much the past 2 weeks and I anticipate further strength gains as participation with these activities increase.  Full RA next week with recommendations to be made at that time.    Examination-Activity Limitations Reach Overhead;Dressing;Sleep    Examination-Participation Restrictions Community Activity    Stability/Clinical Decision Making Stable/Uncomplicated    Rehab Potential  Good    PT Frequency Other (comment)   1-2X/week   PT Duration 4 weeks    PT Treatment/Interventions ADLs/Self Care Home Management;Cryotherapy;Therapeutic activities;Neuromuscular re-education;Therapeutic exercise;Patient/family education;Manual techniques    PT Next Visit Plan RA and FOTO, IR AROM and ER strength with R shoulder    PT Home Exercise Plan Access Code: [email or web address]    Consulted and Agree with Plan of Care Patient             Patient will benefit from skilled therapeutic intervention in order to improve the following deficits and impairments:  Decreased activity tolerance, Decreased endurance, Decreased range of motion, Decreased strength, Increased edema, Impaired perceived functional ability, Impaired UE functional use, Postural dysfunction, Pain  Visit Diagnosis: Abnormal posture  Muscle weakness (generalized)  Localized edema  Stiffness of right shoulder, not elsewhere classified  Acute pain of right shoulder     Problem List Patient Active Problem List   Diagnosis Date Noted   Aortic atherosclerosis ([place]) 08/21/2021   BPH associated with nocturia 11/05/2017   Hearing loss of right ear 05/05/2017   Hyperglycemia 11/04/2016   Hyperlipidemia 11/04/2015   Erectile dysfunction 12/24/2014   History of alcohol abuse 12/24/2014   Allergic rhinitis 10/22/2010   Depression 10/07/2007   GERD 10/07/2007    Farley Ly, PT, MPT 12/05/2021, 3:35 PM  [facility] Physical Therapy [address] [place], [place], [address] Phone: [phone]   Fax:  [phone]  Name: DAREION KNEECE MRN: [id number] Date of Birth: [birth year]

## 2021-12-18 ENCOUNTER — Encounter: Payer: BC Managed Care – PPO | Admitting: Rehabilitative and Restorative Service Providers"

## 2022-01-01 ENCOUNTER — Encounter: Payer: Self-pay | Admitting: Rehabilitative and Restorative Service Providers"

## 2022-01-01 ENCOUNTER — Other Ambulatory Visit: Payer: Self-pay

## 2022-01-01 ENCOUNTER — Ambulatory Visit (INDEPENDENT_AMBULATORY_CARE_PROVIDER_SITE_OTHER): Payer: BC Managed Care – PPO | Admitting: Rehabilitative and Restorative Service Providers"

## 2022-01-01 DIAGNOSIS — R600 Localized edema: Secondary | ICD-10-CM | POA: Diagnosis not present

## 2022-01-01 DIAGNOSIS — M6281 Muscle weakness (generalized): Secondary | ICD-10-CM

## 2022-01-01 DIAGNOSIS — M25611 Stiffness of right shoulder, not elsewhere classified: Secondary | ICD-10-CM | POA: Diagnosis not present

## 2022-01-01 DIAGNOSIS — R293 Abnormal posture: Secondary | ICD-10-CM

## 2022-01-01 NOTE — Patient Instructions (Signed)
Access Code: [email or web address] URL: [email or web address] Date: 01/01/2022 Prepared by: Vista Mink  Exercises Supine Shoulder Internal Rotation Stretch - 1 x daily - 3 x weekly - 1 sets - 10-15 reps - 10 seconds hold Standing Scapular Retraction - 3 x daily - 7 x weekly - 1 sets - 5 reps - 5 second hold Standing Isometric Cervical Extension with Manual Resistance - 3 x daily - 7 x weekly - 1 sets - 5 reps - 5 hold Standing Shoulder Internal Rotation Stretch with Hands Behind Back - 1 x daily - 7 x weekly - 1 sets - 10-15 reps - 10 seconds hold Shoulder External Rotation with Anchored Resistance with Towel Under Elbow - 1 x daily - 3 x weekly - 2-3 sets - 10 reps - 3 hold Standing Shoulder Posterior Capsule Stretch - 1 x daily - 3 x weekly - 1 sets - 5 reps - 10 hold

## 2022-01-01 NOTE — Therapy (Signed)
[facility] [address] [place], [place], [address] Phone: [phone]   Fax:  [phone]  Physical Therapy Treatment  Patient Details  Name: Terry Mcpherson MRN: [id number] Date of Birth: 1961/04/21 Referring Provider (PT): Mcarthur Rossetti MD   Encounter Date: 01/01/2022   PT End of Session - 01/01/22 1753     Visit Number 5    Number of Visits 9    Date for PT Re-Evaluation 12/10/21    Progress Note Due on Visit 9    PT Start Time 4656    PT Stop Time 1103    PT Time Calculation (min) 40 min    Activity Tolerance Patient tolerated treatment well;No increased pain    Behavior During Therapy WFL for tasks assessed/performed             Past Medical History:  Diagnosis Date   Allergy    Anxiety    Chronic sinusitis    Depression    GERD (gastroesophageal reflux disease)     Past Surgical History:  Procedure Laterality Date   COLONOSCOPY     egd with dilatation  2001    There were no vitals filed for this visit.   Subjective Assessment - 01/01/22 1754     Subjective Terry Mcpherson reports his pain has been very mild when he does his exercises and pays attention to his posture and body mechanics.  With increased pain, he knows what to do to calm it down.    Pertinent History NA    Limitations Lifting;House hold activities    Patient Stated Goals Be able to return to the gym without B shoulder pain    Currently in Pain? No/denies    Pain Score 0-No pain    Pain Onset More than a month ago    Pain Frequency Occasional    Pain Relieving Factors NA    Multiple Pain Sites No                [facility] PT Assessment - 01/01/22 0001       Observation/Other Assessments   Focus on Therapeutic Outcomes (FOTO)  80 (was 51, Goal 74)      ROM / Strength   AROM / PROM / Strength AROM;Strength      AROM   Overall AROM  Deficits    AROM Assessment Site Shoulder    Right/Left Shoulder Left;Right    Right Shoulder Flexion 170  Degrees    Right Shoulder Internal Rotation 65 Degrees    Right Shoulder External Rotation 100 Degrees    Right Shoulder Horizontal  ADduction 45 Degrees    Left Shoulder Flexion 170 Degrees    Left Shoulder Internal Rotation 55 Degrees    Left Shoulder External Rotation 90 Degrees    Left Shoulder Horizontal ADduction 55 Degrees      Strength   Overall Strength Deficits    Strength Assessment Site Shoulder    Right/Left Shoulder Left;Right    Right Shoulder Internal Rotation --   48.4 pounds   Right Shoulder External Rotation --   25.1 pounds   Left Shoulder Internal Rotation --   44.4 pounds   Left Shoulder External Rotation --   27.1 pounds   Cervical Extension --   52.5 pounds (was 30.9)   Cervical - Right Side Bend --   27.2 pounds (was 21.3)   Cervical - Left Side Bend --   30.6 pounds (was 21.9)  [facility] Treatment/Exercise - 01/01/22 0001       Exercises   Exercises Shoulder      Shoulder Exercises: Supine   Other Supine Exercises Supine shoulder IR stretch (70 degrees abduction) 25X 10 seconds      Shoulder Exercises: Seated   Other Seated Exercises Cervical Extension Isometrics 10X 5 seconds    Other Seated Exercises Posterior capsule stretch 10X 10 seconds      Shoulder Exercises: Standing   External Rotation Strengthening;Both;10 reps;Theraband;Limitations    Theraband Level (Shoulder External Rotation) Level 3 (Green)    External Rotation Limitations 2 sets with 3 second hold and slow eccentrics    Other Standing Exercises Shoulder blade pinches 10X 5 seconds    Other Standing Exercises Thumb up the back stretch 10X 10 seconds (each)                     PT Education - 01/01/22 1755     Education Details Updated and reviewed HEP.  Reviewed reassessment findings.    Person(s) Educated Patient    Methods Explanation;Demonstration;Verbal cues;Handout    Comprehension Verbalized understanding;Returned  demonstration;Verbal cues required              PT Short Term Goals - 12/05/21 1531       PT SHORT TERM GOAL #1   Title Improve B shoulder IR AROM to 50 degrees.    Baseline 55 degrees R (was 20 degrees)    Time 3    Period Weeks    Status Achieved    Target Date 11/19/21               PT Long Term Goals - 01/01/22 1756       PT LONG TERM GOAL #1   Title Improve FOTO to 74 in 9 visits or less.    Baseline 80 (was 51)    Time 6    Period Weeks    Status Achieved    Target Date 12/10/21      PT LONG TERM GOAL #2   Title Improve neck and B shoulder pain to consistently 0-2/10 on the Numeric Pain Rating Scale.    Baseline Neck 0/10, shoulder can still be 3-4/10 (both were 5/10)    Time 6    Period Weeks    Status Partially Met    Target Date 12/10/21      PT LONG TERM GOAL #3   Title Improve B shoulder flexion AROM to 170; IR to 60 degrees; ER to 90 degrees and horizontal adduction to 40 degrees.    Baseline R shoulder is improving (see objective) but ER strength in particular is still limited.    Time 6    Period Weeks    Status Achieved    Target Date 12/10/21      PT LONG TERM GOAL #4   Title Improve R shoulder ER:IR strength ratio to at least 2:3.    Baseline 1:2    Time 6    Period Weeks    Status On-going    Target Date 12/10/21      PT LONG TERM GOAL #5   Title Terry Mcpherson will be independent with his long-term HEP at DC.    Baseline Started today    Time 6    Period Weeks    Status Achieved    Target Date 12/10/21                   Plan -  01/01/22 1757     Clinical Impression Statement Terry Mcpherson has been doing great with his neck.  His shoulder has improved and continued strength exercise participation should give him the best chance for continued success.  We reviewed his updated HEP and Terry Mcpherson ready for transfer into independent PT.    Examination-Activity Limitations Reach Overhead;Dressing;Sleep    Examination-Participation  Restrictions Community Activity    Stability/Clinical Decision Making Stable/Uncomplicated    Rehab Potential Good    PT Frequency Other (comment)   DC   PT Duration Other (comment)    PT Treatment/Interventions ADLs/Self Care Home Management;Cryotherapy;Therapeutic activities;Neuromuscular re-education;Therapeutic exercise;Patient/family education;Manual techniques    PT Next Visit Plan DC today    PT Home Exercise Plan Access Code: [email or web address]    Consulted and Agree with Plan of Care Patient             Patient will benefit from skilled therapeutic intervention in order to improve the following deficits and impairments:  Decreased activity tolerance, Decreased endurance, Decreased range of motion, Decreased strength, Increased edema, Impaired perceived functional ability, Impaired UE functional use, Postural dysfunction, Pain  Visit Diagnosis: Abnormal posture  Muscle weakness (generalized)  Localized edema  Stiffness of right shoulder, not elsewhere classified     Problem List Patient Active Problem List   Diagnosis Date Noted   Aortic atherosclerosis ([place]) 08/21/2021   BPH associated with nocturia 11/05/2017   Hearing loss of right ear 05/05/2017   Hyperglycemia 11/04/2016   Hyperlipidemia 11/04/2015   Erectile dysfunction 12/24/2014   History of alcohol abuse 12/24/2014   Allergic rhinitis 10/22/2010   Depression 10/07/2007   GERD 10/07/2007    Farley Ly, PT, MPT 01/01/2022, 5:59 PM  [facility] [address] [place], [place], [address] Phone: [phone]   Fax:  [phone]  Name: IRVING BLOOR MRN: [id number] Date of Birth: 1961/05/10

## 2022-04-13 ENCOUNTER — Encounter: Payer: Self-pay | Admitting: Orthopaedic Surgery

## 2022-04-13 ENCOUNTER — Other Ambulatory Visit: Payer: Self-pay

## 2022-04-13 ENCOUNTER — Ambulatory Visit (INDEPENDENT_AMBULATORY_CARE_PROVIDER_SITE_OTHER): Payer: BC Managed Care – PPO | Admitting: Orthopaedic Surgery

## 2022-04-13 DIAGNOSIS — M7542 Impingement syndrome of left shoulder: Secondary | ICD-10-CM | POA: Diagnosis not present

## 2022-04-13 DIAGNOSIS — M7541 Impingement syndrome of right shoulder: Secondary | ICD-10-CM

## 2022-04-13 NOTE — Progress Notes (Signed)
The patient comes in today continuing to deal with bilateral shoulder pain with the left worse than right and signs and symptoms of impingement.  This is been going on now for well over 6 months and we have tried 6 months of conservative treatment including activity modification, steroid injections and outpatient physical therapy that is all well-documented.  He still hurts quite a bit with overhead activities and has a pinching sensation in both shoulders and discomfort in spite of conservative treatment.  We have also tried Celebrex and methocarbamol.  He is very active at 61 years old.  He does have good range of motion of both shoulders but he does use his deltoids more so on the left shoulder for abduction.  He has weakness in abduction with the left shoulder but weakness and limited external rotation on the right shoulder.  Both shoulders show significant signs of impingement with positive Neer and Hawkins signs.  At this point an MRI of both shoulders is warranted to assess the subacromial outlet and the rotator cuff on both shoulders given the failure of conservative treatment for over 6 months combined with his clinical exam findings.

## 2022-04-25 ENCOUNTER — Ambulatory Visit
Admission: RE | Admit: 2022-04-25 | Discharge: 2022-04-25 | Disposition: A | Discharge status: Home / Self Care | Payer: BC Managed Care – PPO | Source: Ambulatory Visit | Attending: Orthopaedic Surgery | Admitting: Orthopaedic Surgery

## 2022-04-25 DIAGNOSIS — M19011 Primary osteoarthritis, right shoulder: Secondary | ICD-10-CM | POA: Diagnosis not present

## 2022-04-25 DIAGNOSIS — M7541 Impingement syndrome of right shoulder: Secondary | ICD-10-CM

## 2022-04-25 DIAGNOSIS — M19012 Primary osteoarthritis, left shoulder: Secondary | ICD-10-CM | POA: Diagnosis not present

## 2022-04-25 DIAGNOSIS — M7542 Impingement syndrome of left shoulder: Secondary | ICD-10-CM

## 2022-05-04 ENCOUNTER — Ambulatory Visit (INDEPENDENT_AMBULATORY_CARE_PROVIDER_SITE_OTHER): Payer: BC Managed Care – PPO | Admitting: Orthopaedic Surgery

## 2022-05-04 ENCOUNTER — Encounter: Payer: Self-pay | Admitting: Orthopaedic Surgery

## 2022-05-04 ENCOUNTER — Other Ambulatory Visit: Payer: Self-pay

## 2022-05-04 DIAGNOSIS — G8929 Other chronic pain: Secondary | ICD-10-CM | POA: Diagnosis not present

## 2022-05-04 DIAGNOSIS — M25511 Pain in right shoulder: Secondary | ICD-10-CM | POA: Diagnosis not present

## 2022-05-04 DIAGNOSIS — M25512 Pain in left shoulder: Secondary | ICD-10-CM

## 2022-05-04 NOTE — Progress Notes (Signed)
The patient is a very pleasant 61 year old gentleman who comes in to go over MRIs of both his shoulders.  He has been able to easily move his shoulders and reach above his head but there is a dull aching pain in both shoulders especially with activities.  He is a pleasant 61 year old gentleman.  I had placed a steroid injection in his right shoulder subacromial outlet in the past and this helped temporize his symptoms.  He was still having significant pain and has been to therapy as well.  Both shoulders move extremely well but certainly have pain at the [facility] joint and mainly the glenohumeral joint on both shoulders.  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  The left shoulder also shows moderate to severe glenohumeral arthritis with loose bodies as well.  The rotator cuff is intact on both shoulders and he has good function.  He would like to stay conservative both shoulders and I agree with this.  I recommended intra-articular steroid injections under fluoroscopy in both shoulder joints.  We can get this set up for him.  I did give him Dshaun Dean's card and he does know Dr. August Saucer peripherally and would like to see him in follow-up at some point for his shoulders.  I agree with this as well.

## 2022-05-14 ENCOUNTER — Encounter: Payer: Self-pay | Admitting: Physical Medicine and Rehabilitation

## 2022-05-14 ENCOUNTER — Ambulatory Visit (INDEPENDENT_AMBULATORY_CARE_PROVIDER_SITE_OTHER): Payer: BC Managed Care – PPO | Admitting: Physical Medicine and Rehabilitation

## 2022-05-14 ENCOUNTER — Ambulatory Visit: Payer: Self-pay

## 2022-05-14 DIAGNOSIS — M25512 Pain in left shoulder: Secondary | ICD-10-CM

## 2022-05-14 DIAGNOSIS — G8929 Other chronic pain: Secondary | ICD-10-CM | POA: Diagnosis not present

## 2022-05-14 DIAGNOSIS — M25511 Pain in right shoulder: Secondary | ICD-10-CM | POA: Diagnosis not present

## 2022-05-14 NOTE — Progress Notes (Unsigned)
Pt state bil shoulder pain. Pt state any movement with his arm makes the pain worse. Pt state he takes over the counter pain meds and has been taking PT to help ease his pain.  Numeric Pain Rating Scale and Functional Assessment Average Pain 5   In the last MONTH (on 0-10 scale) has pain interfered with the following?  1. General activity like being  able to carry out your everyday physical activities such as walking, climbing stairs, carrying groceries, or moving a chair?  Rating(10)   -BT, -Dye Allergies.

## 2022-05-14 NOTE — Progress Notes (Signed)
   Terry Mcpherson - 61 y.o. male MRN [id number]  Date of birth: [birth year]  Office Visit Note: Visit Date: 05/14/2022 PCP: Shelva Majestic, MD Referred by: Kathryne Hitch*  Subjective: Chief Complaint  Patient presents with   Right Shoulder - Pain   Left Shoulder - Pain   HPI:  Terry Mcpherson is a 61 y.o. male who comes in today at the request of Dr. Doneen Poisson for planned Bilateral anesthetic glenohumeral arthrogram with fluoroscopic guidance.  The patient has failed conservative care including home exercise, medications, time and activity modification.  This injection will be diagnostic and hopefully therapeutic.  Please see requesting physician notes for further details and justification.    ROS Otherwise per HPI.  Assessment & Plan: Visit Diagnoses:    ICD-10-CM   1. Chronic pain of both shoulders  M25.511 Large Joint Inj: bilateral glenohumeral   G89.29 XR C-ARM NO REPORT   M25.512       Plan: No additional findings.   Meds & Orders: No orders of the defined types were placed in this encounter.   Orders Placed This Encounter  Procedures   Large Joint Inj: bilateral glenohumeral   XR C-ARM NO REPORT    Follow-up: Return for visit to requesting provider as needed.   Procedures: Large Joint Inj: bilateral glenohumeral on 05/14/2022 8:19 AM Indications: pain and diagnostic evaluation Details: 22 G 3.5 in needle, fluoroscopy-guided anteromedial approach  Arthrogram: No  Medications (Right): 40 mg triamcinolone acetonide 40 MG/ML; 5 mL bupivacaine 0.25 % Medications (Left): 40 mg triamcinolone acetonide 40 MG/ML; 5 mL bupivacaine 0.25 % Outcome: tolerated well, no immediate complications  There was excellent flow of contrast producing a partial arthrogram of the glenohumeral joint. The patient did have relief of symptoms during the anesthetic phase of the injection. Procedure, treatment alternatives, risks and benefits explained, specific risks  discussed. Consent was given by the patient. Immediately prior to procedure a time out was called to verify the correct patient, procedure, equipment, support staff and site/side marked as required. Patient was prepped and draped in the usual sterile fashion.          Clinical History: No specialty comments available.     Objective:  VS:  HT:    WT:   BMI:     BP:   HR: bpm  TEMP: ( )  RESP:  Physical Exam   Imaging: No results found.

## 2022-05-18 MED ORDER — BUPIVACAINE HCL 0.25 % IJ SOLN
5.0000 mL | INTRAMUSCULAR | Status: AC | PRN
  Administered 2022-05-14: 5 mL via INTRA_ARTICULAR

## 2022-05-18 MED ORDER — TRIAMCINOLONE ACETONIDE 40 MG/ML IJ SUSP
40.0000 mg | INTRAMUSCULAR | Status: AC | PRN
  Administered 2022-05-14: 40 mg via INTRA_ARTICULAR

## 2022-06-18 DIAGNOSIS — L812 Freckles: Secondary | ICD-10-CM | POA: Diagnosis not present

## 2022-06-18 DIAGNOSIS — L821 Other seborrheic keratosis: Secondary | ICD-10-CM | POA: Diagnosis not present

## 2022-06-18 DIAGNOSIS — L309 Dermatitis, unspecified: Secondary | ICD-10-CM | POA: Diagnosis not present

## 2022-06-18 DIAGNOSIS — D1801 Hemangioma of skin and subcutaneous tissue: Secondary | ICD-10-CM | POA: Diagnosis not present

## 2022-08-05 ENCOUNTER — Ambulatory Visit (INDEPENDENT_AMBULATORY_CARE_PROVIDER_SITE_OTHER): Payer: BC Managed Care – PPO | Admitting: Family Medicine

## 2022-08-05 ENCOUNTER — Encounter: Payer: Self-pay | Admitting: Family Medicine

## 2022-08-05 VITALS — BP 104/60 | HR 82 | Temp 98.6°F | Ht 75.0 in | Wt 213.6 lb

## 2022-08-05 DIAGNOSIS — I700 Atherosclerosis of aorta: Secondary | ICD-10-CM

## 2022-08-05 DIAGNOSIS — Z23 Encounter for immunization: Secondary | ICD-10-CM

## 2022-08-05 DIAGNOSIS — E785 Hyperlipidemia, unspecified: Secondary | ICD-10-CM

## 2022-08-05 DIAGNOSIS — Z125 Encounter for screening for malignant neoplasm of prostate: Secondary | ICD-10-CM | POA: Diagnosis not present

## 2022-08-05 DIAGNOSIS — R739 Hyperglycemia, unspecified: Secondary | ICD-10-CM

## 2022-08-05 DIAGNOSIS — Z0000 Encounter for general adult medical examination without abnormal findings: Secondary | ICD-10-CM | POA: Diagnosis not present

## 2022-08-05 DIAGNOSIS — Z79899 Other long term (current) drug therapy: Secondary | ICD-10-CM | POA: Diagnosis not present

## 2022-08-05 DIAGNOSIS — F325 Major depressive disorder, single episode, in full remission: Secondary | ICD-10-CM

## 2022-08-05 DIAGNOSIS — H9191 Unspecified hearing loss, right ear: Secondary | ICD-10-CM

## 2022-08-05 LAB — CBC WITH DIFFERENTIAL/PLATELET
Basophils Absolute: 0.1 10*3/uL (ref 0.0–0.1)
Basophils Relative: 1 % (ref 0.0–3.0)
Eosinophils Absolute: 0.3 10*3/uL (ref 0.0–0.7)
Eosinophils Relative: 4 % (ref 0.0–5.0)
HCT: 44.3 % (ref 39.0–52.0)
Hemoglobin: 15.1 g/dL (ref 13.0–17.0)
Lymphocytes Relative: 27.1 % (ref 12.0–46.0)
Lymphs Abs: 1.9 10*3/uL (ref 0.7–4.0)
MCHC: 34.1 g/dL (ref 30.0–36.0)
MCV: 93 fl (ref 78.0–100.0)
Monocytes Absolute: 0.6 10*3/uL (ref 0.1–1.0)
Monocytes Relative: 8.5 % (ref 3.0–12.0)
Neutro Abs: 4.1 10*3/uL (ref 1.4–7.7)
Neutrophils Relative %: 59.4 % (ref 43.0–77.0)
Platelets: 306 10*3/uL (ref 150.0–400.0)
RBC: 4.76 Mil/uL (ref 4.22–5.81)
RDW: 12.7 % (ref 11.5–15.5)
WBC: 6.9 10*3/uL (ref 4.0–10.5)

## 2022-08-05 LAB — COMPREHENSIVE METABOLIC PANEL
ALT: 26 U/L (ref 0–53)
AST: 23 U/L (ref 0–37)
Albumin: 4.1 g/dL (ref 3.5–5.2)
Alkaline Phosphatase: 60 U/L (ref 39–117)
BUN: 11 mg/dL (ref 6–23)
CO2: 28 mEq/L (ref 19–32)
Calcium: 9.4 mg/dL (ref 8.4–10.5)
Chloride: 104 mEq/L (ref 96–112)
Creatinine, Ser: 0.88 mg/dL (ref 0.40–1.50)
GFR: 93.02 mL/min (ref >60.00)
Glucose, Bld: 90 mg/dL (ref 70–99)
Potassium: 4 mEq/L (ref 3.5–5.1)
Sodium: 139 mEq/L (ref 135–145)
Total Bilirubin: 0.6 mg/dL (ref 0.2–1.2)
Total Protein: 7.1 g/dL (ref 6.0–8.3)

## 2022-08-05 LAB — PSA: PSA: 1.29 ng/mL (ref 0.10–4.00)

## 2022-08-05 LAB — LIPID PANEL
Cholesterol: 161 mg/dL (ref 0–200)
HDL: 38.6 mg/dL — ABNORMAL LOW (ref >39.00)
LDL Cholesterol: 93 mg/dL (ref 0–99)
NonHDL: 122.38
Total CHOL/HDL Ratio: 4
Triglycerides: 145 mg/dL (ref 0.0–149.0)
VLDL: 29 mg/dL (ref 0.0–40.0)

## 2022-08-05 LAB — VITAMIN B12: Vitamin B-12: 290 pg/mL (ref 211–911)

## 2022-08-05 LAB — HEMOGLOBIN A1C: Hgb A1c MFr Bld: 5.7 % (ref 4.6–6.5)

## 2022-08-05 NOTE — Progress Notes (Signed)
Phone: [phone]   Subjective:  Patient presents today for their annual physical. Chief complaint-noted.   See problem oriented charting- ROS- full  review of systems was completed and negative  except for: shoulder pain ,hearing loss right ear  The following were reviewed and entered/updated in epic: Past Medical History:  Diagnosis Date   Allergy    Anxiety    Chronic sinusitis    Depression    GERD (gastroesophageal reflux disease)    Patient Active Problem List   Diagnosis Date Noted   BPH associated with nocturia 11/05/2017    Priority: Medium    Hearing loss of right ear 05/05/2017    Priority: Medium    Hyperglycemia 11/04/2016    Priority: Medium    Hyperlipidemia 11/04/2015    Priority: Medium    History of alcohol abuse 12/24/2014    Priority: Medium    Depression 10/07/2007    Priority: Medium    Erectile dysfunction 12/24/2014    Priority: Low   Allergic rhinitis 10/22/2010    Priority: Low   GERD 10/07/2007    Priority: Low   Aortic atherosclerosis ([facility]) 08/21/2021    Priority: 2.   Past Surgical History:  Procedure Laterality Date   COLONOSCOPY     egd with dilatation  2001    Family History  Problem Relation Age of Onset   Depression Mother        sister, brother   Deep vein thrombosis Father        PE after fracture   Depression Father    Hyperlipidemia Father    Colon cancer Neg Hx     Medications- reviewed and updated Current Outpatient Medications  Medication Sig Dispense Refill   celecoxib (CELEBREX) 200 MG capsule Take 1 capsule (200 mg total) by mouth 2 (two) times daily between meals as needed. 60 capsule 3   methocarbamol (ROBAXIN) 500 MG tablet Take 1 tablet (500 mg total) by mouth every 6 (six) hours as needed. 40 tablet 1   omeprazole (PRILOSEC) 20 MG capsule Take 20 mg by mouth daily.     rosuvastatin (CRESTOR) 5 MG tablet Take 1 tablet (5 mg total) by mouth daily. 90 tablet 3   sertraline (ZOLOFT) 100 MG tablet TAKE  1.5 TABLETS BY MOUTH DAILY 135 tablet 3   sildenafil (REVATIO) 20 MG tablet TAKE ONE TABLET BY MOUTH THREE TIMES A DAY 90 tablet 0   sildenafil (REVATIO) 20 MG tablet Take 1-3 tablets daily as needed 90 tablet 3   No current facility-administered medications for this visit.    Allergies-reviewed and updated Allergies  Allergen Reactions   Penicillins     hives    Social History   Social History Narrative   Married. 3 children, 2 step kids. No grandkids.       Works in [employer]: workout regularly, theatre, church involvement   Objective  Objective:  BP 104/60   Pulse 82   Temp 98.6 F (37 C)   Ht 6\' 3"  (1.905 m)   Wt 213 lb 9.6 oz (96.9 kg)   SpO2 95%   BMI 26.70 kg/m  Gen: NAD, resting comfortably HEENT: Mucous membranes are moist. Oropharynx normal Neck: no thyromegaly CV: RRR no murmurs rubs or gallops Lungs: CTAB no crackles, wheeze, rhonchi Abdomen: soft/nontender/nondistended/normal bowel sounds. No rebound or guarding.  Ext: no edema Skin: warm, dry Neuro: grossly normal, moves all extremities, PERRLA   Assessment and Plan  61  y.o. male presenting for annual physical.  Health Maintenance counseling: 1. Anticipatory guidance: Patient counseled regarding regular dental exams -q6 months, eye exams - seen recently [facility],  avoiding smoking and second hand smoke, limiting alcohol to 2 beverages per day-over 20 years alcohol free and still does AA and helps other, no illicit drugs.   2. Risk factor reduction:  Advised patient of need for regular exercise and diet rich and fruits and vegetables to reduce risk of heart attack and stroke.  Exercise-at [employer] (better for health and mental health) and walking dogs twice daily-some shoulder issues.  Diet/weight management-weight down 2 pounds from last year- wants more gradual weight loss and get under 210 or even 200.  Wt Readings from Last 3 Encounters:  08/05/22  213 lb 9.6 oz (96.9 kg)  08/04/21 215 lb 3.2 oz (97.6 kg)  06/03/21 215 lb (97.5 kg)  3. Immunizations/screenings/ancillary studies-flu vaccination today, could get updated COVID vaccination when new variant variation is available Immunization History  Administered Date(s) Administered   Hepatitis A, Adult 05/13/2021   Influenza Inj Mdck Quad Pf 07/24/2019   Influenza Whole 10/07/2007   Influenza,inj,Quad PF,6+ Mos 11/04/2016, 08/08/2019, 09/05/2020, 08/04/2021, 08/05/2022   Influenza-Unspecified 07/20/2017, 08/23/2018   PFIZER Comirnaty(Gray Top)Covid-19 Tri-Sucrose Vaccine 03/26/2021   PFIZER(Purple Top)SARS-COV-2 Vaccination 02/04/2020, 02/28/2020, 10/16/2020   Pfizer Covid-19 Vaccine Bivalent Booster 37yrs & up 09/25/2021   Td 06/30/2010, 05/09/2021   Typhoid Inactivated 05/13/2021   Zoster Recombinat (Shingrix) 12/08/2018, 03/22/2019  4. Prostate cancer screening- low risk prior PSA trend-continue to trend with labs today  Lab Results  Component Value Date   PSA 1.01 08/04/2021   PSA 1.02 12/15/2019   PSA 1.08 12/08/2018   5. Colon cancer screening - normal colonoscopy 09/25/2014 with repeat in 10 years planned 6. Skin cancer screening-referred to dermatology last year-patient reports saw for dermatology but was told more friction issue- not tinea (cortisone helped). advised regular sunscreen use. Denies worrisome, changing, or new skin lesions.  7. Smoking associated screening (lung cancer screening, AAA screen 65-75, UA)-never smoker 8. STD screening -only active with wife/declines  Status of chronic or acute concerns   #hyperlipidemia- ct cardiac scoring 37.9 in 08/21/21 53% for age- started statin #Aortic atherosclerosis S: Medication:Rosuvastatin 5 mg daily- no side effects Lab Results  Component Value Date   CHOL 199 08/04/2021   HDL 35.20 (L) 08/04/2021   LDLCALC 128 (H) 08/04/2021   LDLDIRECT 149.0 12/08/2018   TRIG 178.0 (H) 08/04/2021   CHOLHDL 6 08/04/2021  A/P:  update lipids- unlikely to increase dose unless LDL still over 100- though ideal would be under 70. Aortic atherosclerosis likely stable on this dose- continue current meds  # Depression S: Medication: sertraline 150 mg    08/05/2022    9:55 AM 05/09/2021   10:16 AM 12/15/2019    9:09 AM  Depression screen PHQ 2/9  Decreased Interest 0 0 0  Down, Depressed, Hopeless 0 0 0  PHQ - 2 Score 0 0 0  Altered sleeping 0 0 0  Tired, decreased energy 0 0 0  Change in appetite 0 0 0  Feeling bad or failure about yourself  0 0 0  Trouble concentrating 0 0 0  Moving slowly or fidgety/restless 0 0 0  Suicidal thoughts 0 0 0  PHQ-9 Score 0 0 0  Difficult doing work/chores Not difficult at all Not difficult at all Not difficult at all    A/P: doing well- full remission- continue current meds     #  Hyperglycemia/insulin resistance/prediabetes-but thankfully A1c not elevated S:  Medication: none  A/P: update a1c though they traditionally have bene good- especially with starting statin   #Erectile dysfunction-sildenafil helpful- can refill as needed   #GERD-omeprazole/Prilosec 20 mg helpful-Pepcid trial did not help in the past  #Relative B12 deficiency-recommended B12 last year-patient reports taking MV with 400% daily need   #Hearing loss of right ear-referred back to ENT last year-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 MRI did not show rotator cuff issues but did have arthritis- had injections whih helped significantly for 2 months but wearing off   Recommended follow up: Return in about 1 year (around 08/06/2023) for physical or sooner if needed.Schedule b4 you leave.  Lab/Order associations: fasting   ICD-10-CM   1. Preventative health care  Z00.00     2. Hyperlipidemia, unspecified hyperlipidemia type  E78.5     3. Screening for prostate cancer  Z12.5     4. Hyperglycemia  R73.9      5. High risk medication use  Z79.899     6. Hearing loss of right ear, unspecified hearing loss type  H91.91     7. Need for immunization against influenza  Z23 Flu Vaccine QUAD 56mo+IM (Fluarix, Fluzone & Alfiuria Quad PF)     No orders of the defined types were placed in this encounter.  Return precautions advised.  [name], MD

## 2022-08-05 NOTE — Patient Instructions (Addendum)
Please stop by lab before you go If you have mychart- we will send your results within 3 business days of [place] receiving them.  If you do not have mychart- we will call you about results within 5 business days of [place] receiving them.  *please also note that you will see labs on mychart as soon as they post. I will later go in and write notes on them- will say "notes from Dr. Durene Cal"   We will call you within two weeks about your referral to ENT on [address]. If you do not hear within 2 weeks, give [place] a call.    Recommended follow up: Return in about 1 year (around 08/06/2023) for physical or sooner if needed.Schedule b4 you leave.

## 2022-08-24 IMAGING — MR MR SHOULDER*L* W/O CM
4 of 5 series · 20 of 40 positions shown · non-contrast
Comparison: None Available.

CLINICAL DATA: Shoulder pain. Rotator cuff disorder suspected.
Impingement syndrome of left shoulder. Chronic left shoulder pain
for 2 years.

EXAM:
MRI OF THE LEFT SHOULDER WITHOUT CONTRAST
TECHNIQUE: Multiplanar, multisequence MR imaging of the shoulder was performed.
No intravenous contrast was administered.

[Series 4: T2 fat-sat · axial · left · 3.0mm · 0.50mm/px · z∈[-99,-11]mm · 7 of 27 slices shown (1 of 3)]
[im 1/27]
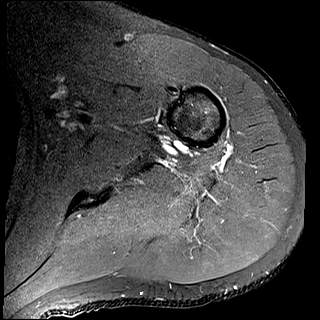
[im 3/27]
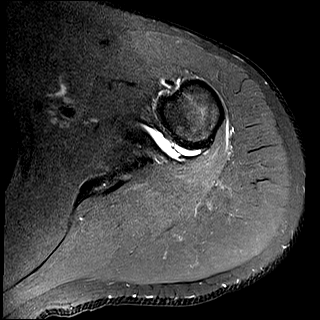
[im 9/27]
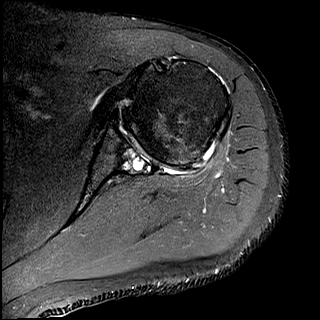
[im 12/27]
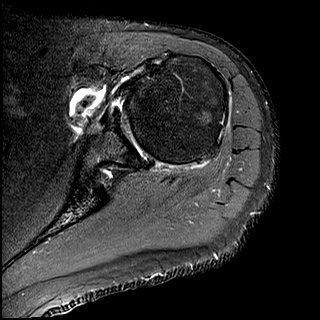
[im 15/27]
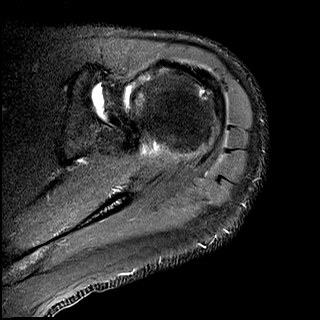
[im 18/27]
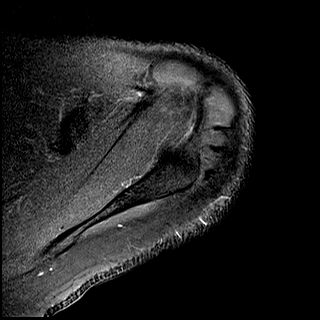
[im 24/27]
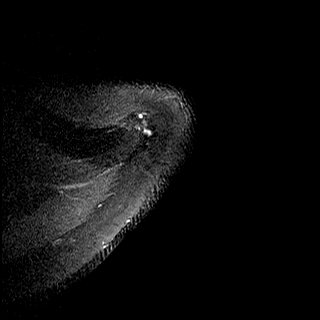

[Series 5: T2 fat-sat · coronal · left · 4.0mm · 0.22mm/px · 3 of 21 slices shown (2 of 3)]
[im 4/21]
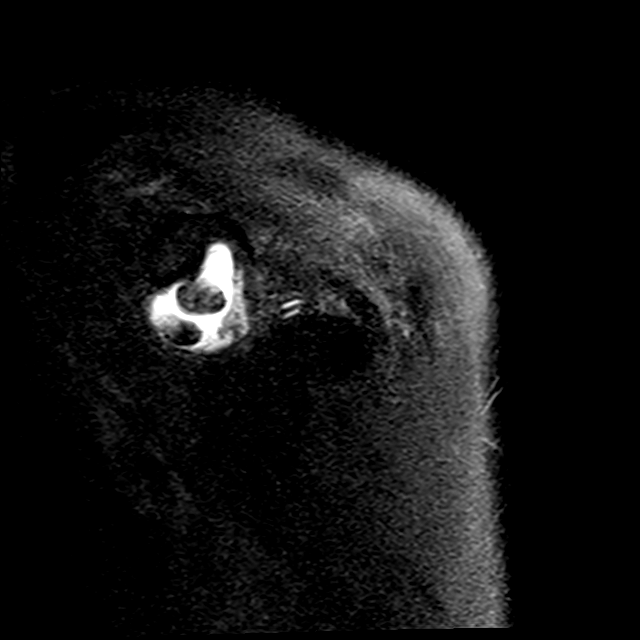
[im 11/21]
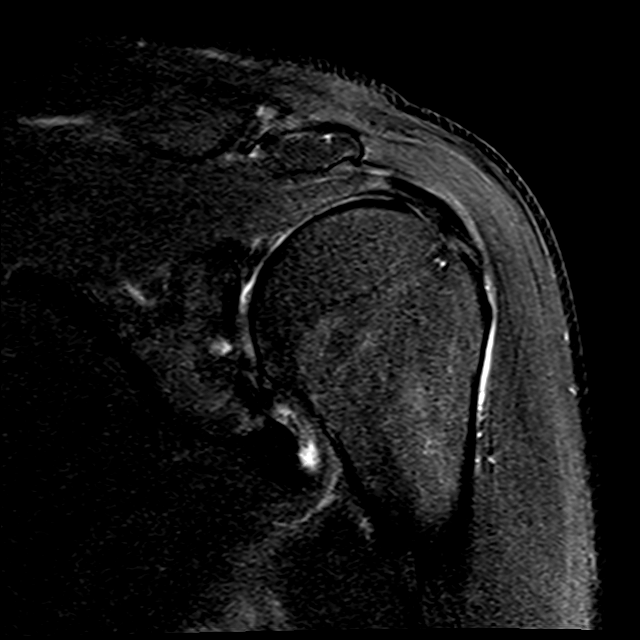
[im 17/21]
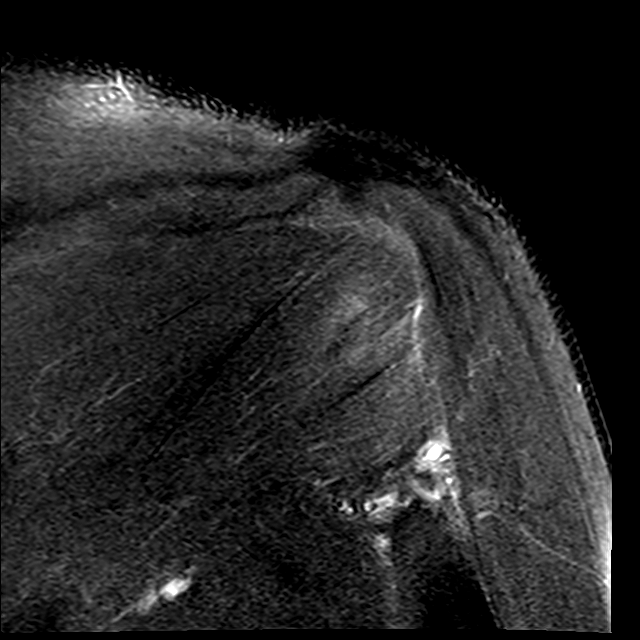

[Series 6: PD · coronal · left · 4.0mm · 0.22mm/px · 7 of 21 slices shown]
[im 1/21]
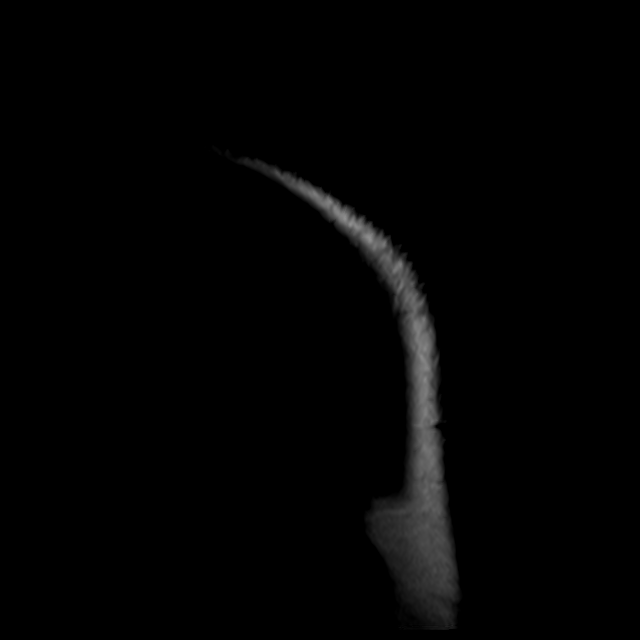
[im 4/21]
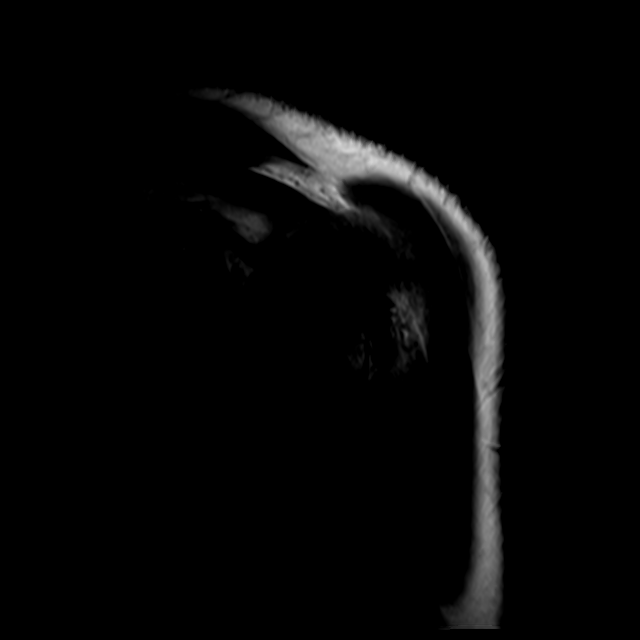
[im 7/21]
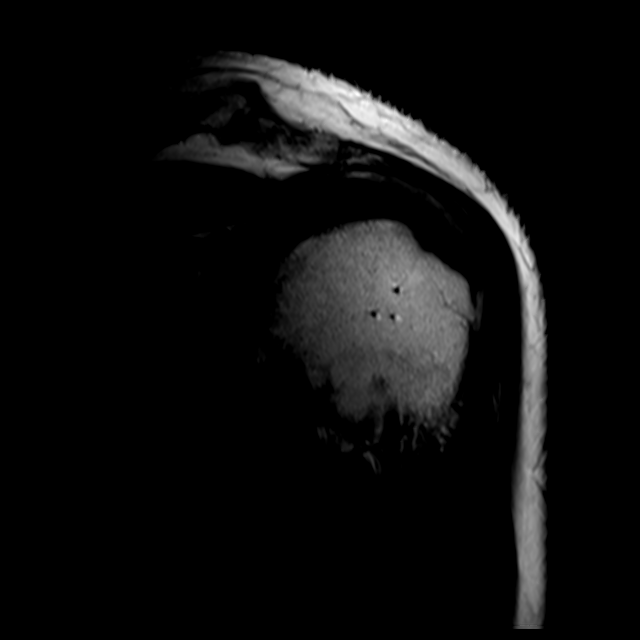
[im 11/21]
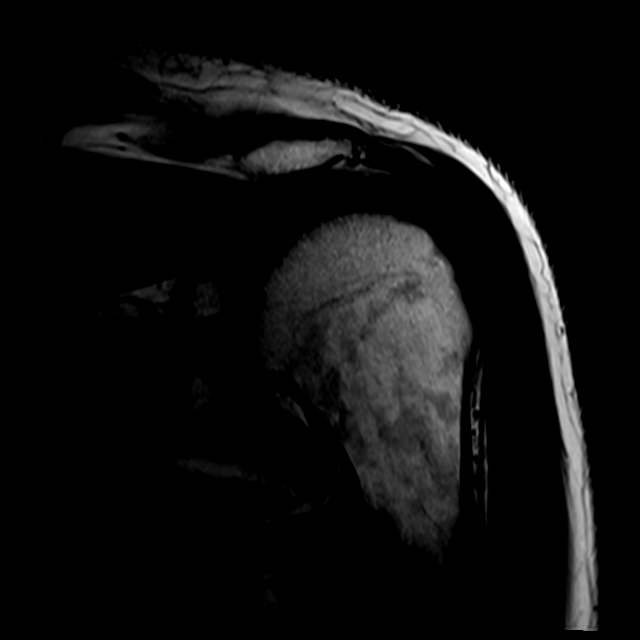
[im 14/21]
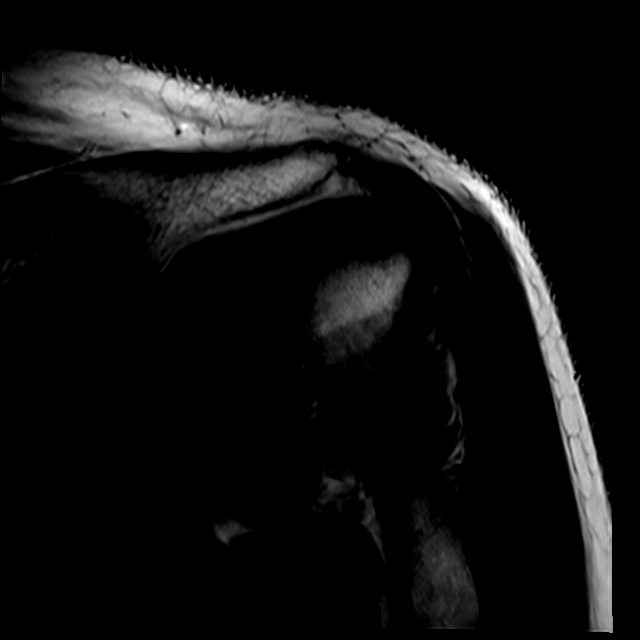
[im 17/21]
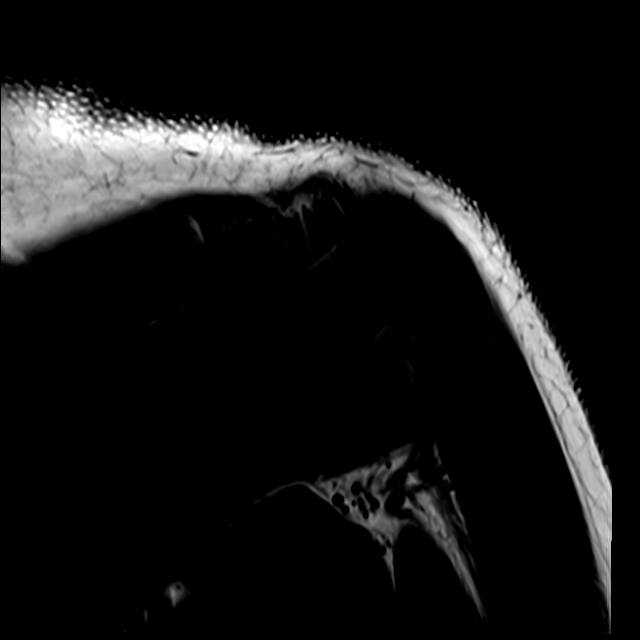
[im 21/21]
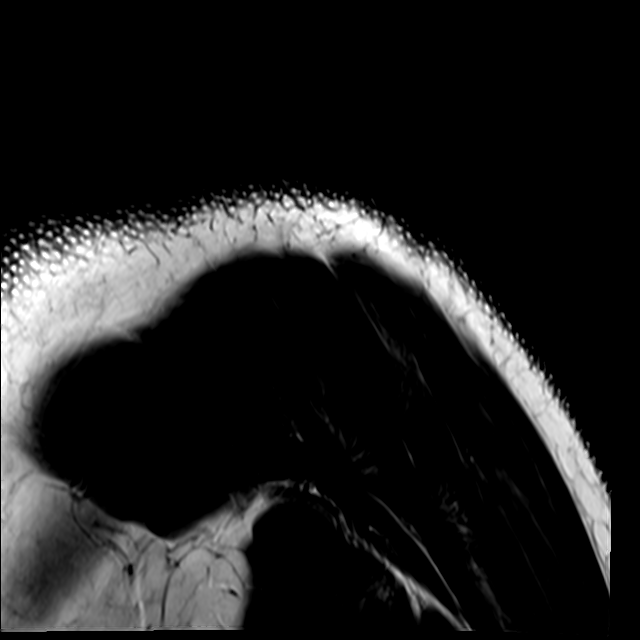

[Series 7: T2 fat-sat · sagittal · left · 4.0mm · 0.47mm/px · 3 of 23 slices shown (3 of 3)]
[im 4/23]
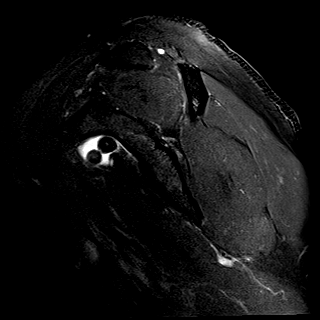
[im 13/23]
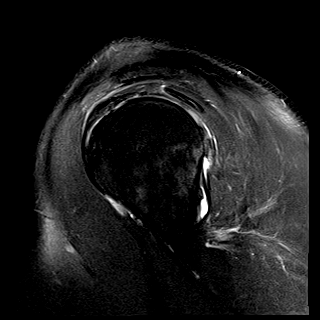
[im 19/23]
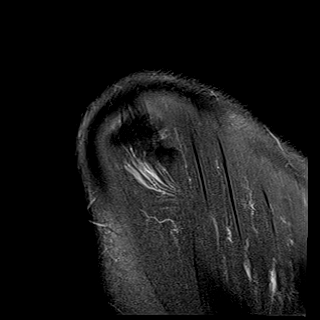

[20 of 40 positions shown; findings below may reference images not displayed]

FINDINGS: Rotator cuff: There is very mild intermediate T2 signal
supraspinatus and infraspinatus tendinosis without a fluid bright
tear. Minimal subcortical cystic change within the humeral head just
deep to the posterior supraspinatus tendon insertion. The
subscapularis and teres minor are intact.

Muscles: No rotator cuff muscle atrophy, fatty infiltration, or
edema.

Biceps long head: The intra-articular long head of the biceps tendon
is intact.

Acromioclavicular Joint: There are mild degenerative changes of the
acromioclavicular joint including joint space narrowing, subchondral
marrow edema, and peripheral osteophytosis. Type II acromion. No
subacromial/subdeltoid bursitis.

Glenohumeral Joint: There is predominantly full-thickness cartilage
loss within the posterior mid to lower glenoid with high-grade
subchondral degenerative cystic changes. Moderate thinning elsewhere
within the glenoid cartilage. Moderate humeral head-neck junction
degenerative osteophytes. Moderate to high-grade thinning of the
medial humeral head cartilage. Moderate inferior humeral head-neck
junction degenerative osteophytes.

Labrum: Moderate attenuation and degenerative irregularity of the
posterosuperior glenoid labrum.

Bones:  No acute fracture.

Other: There are 3 loose bodies within the subscapular recess
measuring up to 14 mm, 10 mm, and 12 mm, respectively.
IMPRESSION: 1. Minimal supraspinatus and infraspinatus tendinosis. No rotator
cuff tear.
2. Mild degenerative changes of the acromioclavicular joint.
3. Moderate to severe glenohumeral osteoarthritis.
4. There are 3 loose bodies within the subscapular recess, measuring
up to 14 mm.

## 2022-08-27 ENCOUNTER — Other Ambulatory Visit: Payer: Self-pay | Admitting: Family Medicine

## 2022-09-14 ENCOUNTER — Telehealth: Payer: Self-pay | Admitting: Orthopaedic Surgery

## 2022-09-14 NOTE — Telephone Encounter (Signed)
Patient called asked if he can be referred back to Dr. Ernestina Patches to get injections in both of his shoulders?  The number to contact patient is [phone]

## 2022-09-14 NOTE — Telephone Encounter (Signed)
Lvm for pt to cb to see if he is ok with just seeing dr. Rolena Infante for inj or if he wants to specially see dr. Ernestina Patches

## 2022-09-15 ENCOUNTER — Telehealth: Payer: Self-pay | Admitting: Physical Medicine and Rehabilitation

## 2022-09-15 NOTE — Telephone Encounter (Signed)
Pt called to set an appt with Dr Ernestina Patches. Please call pt at [phone].

## 2022-09-15 NOTE — Telephone Encounter (Signed)
scheduled

## 2022-09-15 NOTE — Telephone Encounter (Signed)
Last seen June-ok for injection or OV needed?

## 2022-09-23 ENCOUNTER — Ambulatory Visit (INDEPENDENT_AMBULATORY_CARE_PROVIDER_SITE_OTHER): Payer: BC Managed Care – PPO | Admitting: Physical Medicine and Rehabilitation

## 2022-09-23 ENCOUNTER — Ambulatory Visit: Payer: Self-pay

## 2022-09-23 DIAGNOSIS — G8929 Other chronic pain: Secondary | ICD-10-CM | POA: Diagnosis not present

## 2022-09-23 DIAGNOSIS — M25511 Pain in right shoulder: Secondary | ICD-10-CM

## 2022-09-23 DIAGNOSIS — M25512 Pain in left shoulder: Secondary | ICD-10-CM | POA: Diagnosis not present

## 2022-09-23 NOTE — Progress Notes (Unsigned)
Numeric Pain Rating Scale and Functional Assessment Average Pain 5   In the last MONTH (on 0-10 scale) has pain interfered with the following?  1. General activity like being  able to carry out your everyday physical activities such as walking, climbing stairs, carrying groceries, or moving a chair?  Rating(8)   +Driver, -BT, -Dye Allergies.  Reaching up makes pain worse

## 2022-09-23 NOTE — Progress Notes (Unsigned)
   ROQUE SCHILL - 61 y.o. male MRN [id number]  Date of birth: 02-Sep-1961  Office Visit Note: Visit Date: 09/23/2022 PCP: Marin Olp, MD Referred by: Marin Olp, MD  Subjective: Chief Complaint  Patient presents with   Right Shoulder - Pain   Left Shoulder - Pain   HPI:  ENRRIQUE MIERZWA is a 61 y.o. male who comes in todayHPI ROS Otherwise per HPI.  Assessment & Plan: Visit Diagnoses:    ICD-10-CM   1. Chronic pain of both shoulders  M25.511 XR C-ARM NO REPORT   G89.29    M25.512       Plan: No additional findings.   Meds & Orders: No orders of the defined types were placed in this encounter.   Orders Placed This Encounter  Procedures   XR C-ARM NO REPORT    Follow-up: No follow-ups on file.   Procedures: Large Joint Inj: bilateral glenohumeral on 09/23/2022 1:37 PM Indications: pain and diagnostic evaluation Details: 22 G 3.5 in needle, fluoroscopy-guided anteromedial approach  Arthrogram: No  Medications (Right): 40 mg triamcinolone acetonide 40 MG/ML; 5 mL bupivacaine 0.25 % Medications (Left): 40 mg triamcinolone acetonide 40 MG/ML; 5 mL bupivacaine 0.25 % Outcome: tolerated well, no immediate complications  There was excellent flow of contrast producing a partial arthrogram of the glenohumeral joint. The patient did have relief of symptoms during the anesthetic phase of the injection. Procedure, treatment alternatives, risks and benefits explained, specific risks discussed. Consent was given by the patient. Immediately prior to procedure a time out was called to verify the correct patient, procedure, equipment, support staff and site/side marked as required. Patient was prepped and draped in the usual sterile fashion.          Clinical History: No specialty comments available.     Objective:  VS:  HT:    WT:   BMI:     BP:   HR: bpm  TEMP: ( )  RESP:  Physical Exam   Imaging: No results found.

## 2022-09-24 MED ORDER — TRIAMCINOLONE ACETONIDE 40 MG/ML IJ SUSP
40.0000 mg | INTRAMUSCULAR | Status: AC | PRN
  Administered 2022-09-23: 40 mg via INTRA_ARTICULAR

## 2022-09-24 MED ORDER — BUPIVACAINE HCL 0.25 % IJ SOLN
5.0000 mL | INTRAMUSCULAR | Status: AC | PRN
  Administered 2022-09-23: 5 mL via INTRA_ARTICULAR

## 2023-01-22 ENCOUNTER — Telehealth: Payer: Self-pay | Admitting: Physical Medicine and Rehabilitation

## 2023-01-22 NOTE — Telephone Encounter (Signed)
Patient requesting a injection and visit with Dr. Ernestina Patches

## 2023-01-26 ENCOUNTER — Other Ambulatory Visit: Payer: Self-pay | Admitting: Physical Medicine and Rehabilitation

## 2023-01-26 DIAGNOSIS — M7541 Impingement syndrome of right shoulder: Secondary | ICD-10-CM

## 2023-01-26 NOTE — Telephone Encounter (Signed)
Spoke with patient and he is starting to have shoulder pain again. He wants to just get the right one done as it it giving him the most trouble. Last injection lasted 1.5 months. No new injuries, accidents or falls. Please advise

## 2023-02-08 ENCOUNTER — Ambulatory Visit: Payer: BC Managed Care – PPO | Admitting: Physical Medicine and Rehabilitation

## 2023-02-08 ENCOUNTER — Telehealth: Payer: Self-pay | Admitting: Physical Medicine and Rehabilitation

## 2023-02-08 NOTE — Telephone Encounter (Signed)
Rescheduled injection to 02/11/23

## 2023-02-08 NOTE — Telephone Encounter (Signed)
Pt returned call to [place] J to reschedule appt. Please call pt at [phone].

## 2023-02-11 ENCOUNTER — Ambulatory Visit (INDEPENDENT_AMBULATORY_CARE_PROVIDER_SITE_OTHER): Payer: BC Managed Care – PPO | Admitting: Physical Medicine and Rehabilitation

## 2023-02-11 ENCOUNTER — Other Ambulatory Visit: Payer: Self-pay

## 2023-02-11 DIAGNOSIS — G8929 Other chronic pain: Secondary | ICD-10-CM | POA: Diagnosis not present

## 2023-02-11 DIAGNOSIS — M25511 Pain in right shoulder: Secondary | ICD-10-CM | POA: Diagnosis not present

## 2023-02-11 NOTE — Progress Notes (Signed)
Functional Pain Scale - descriptive words and definitions  Distracting (5)    Aware of pain/able to complete some ADL's but limited by pain/sleep is affected and active distractions are only slightly useful. Moderate range order  Average Pain  varies   +Driver, -BT, -Dye Allergies.  Right Shoulder pain

## 2023-02-17 MED ORDER — TRIAMCINOLONE ACETONIDE 40 MG/ML IJ SUSP
40.0000 mg | INTRAMUSCULAR | Status: AC | PRN
  Administered 2023-02-11: 40 mg via INTRA_ARTICULAR

## 2023-02-17 MED ORDER — BUPIVACAINE HCL 0.25 % IJ SOLN
5.0000 mL | INTRAMUSCULAR | Status: AC | PRN
  Administered 2023-02-11: 5 mL via INTRA_ARTICULAR

## 2023-02-17 NOTE — Progress Notes (Addendum)
   Terry Mcpherson - 62 y.o. male MRN [id number]  Date of birth: [birth year]  Office Visit Note: Visit Date: 02/11/2023 PCP: Shelva Majestic, MD Referred by: Shelva Majestic, MD  Subjective: Chief Complaint  Patient presents with   Right Shoulder - Pain   HPI:  Terry Mcpherson is a 62 y.o. male who comes in today at the request of Dr. Doneen Poisson for planned Right anesthetic glenohumeral arthrogram with fluoroscopic guidance.  The patient has failed conservative care including home exercise, medications, time and activity modification.  This injection will be diagnostic and hopefully therapeutic.  Please see requesting physician notes for further details and justification.  He did mention to me today about Dr. Burnard Bunting in terms of him shoulders.  I did tell him Dr. August Saucer does complete shoulder replacements and does do a lot of upper arm extremity work.  However I did suggest to him he would need to talk to Dr. Magnus Ivan who sees him from an orthopedic standpoint and discuss any further surgical potential for his shoulders.   ROS Otherwise per HPI.  Assessment & Plan: Visit Diagnoses:    ICD-10-CM   1. Chronic right shoulder pain  M25.511 XR C-ARM NO REPORT   G89.29 Large Joint Inj: R glenohumeral    bupivacaine (MARCAINE) 0.25 % (with pres) injection 5 mL    triamcinolone acetonide (KENALOG-40) injection 40 mg      Plan: No additional findings.   Meds & Orders:  Meds ordered this encounter  Medications   bupivacaine (MARCAINE) 0.25 % (with pres) injection 5 mL   triamcinolone acetonide (KENALOG-40) injection 40 mg    Orders Placed This Encounter  Procedures   Large Joint Inj: R glenohumeral   XR C-ARM NO REPORT    Follow-up: Return for visit to requesting provider as needed.   Procedures: Large Joint Inj: R glenohumeral on 02/11/2023 3:30 PM Indications: pain and diagnostic evaluation Details: 22 G 3.5 in needle, fluoroscopy-guided anteromedial  approach  Arthrogram: No  Medications: 40 mg triamcinolone acetonide 40 MG/ML; 5 mL bupivacaine 0.25 % Outcome: tolerated well, no immediate complications  There was excellent flow of contrast producing a partial arthrogram of the glenohumeral joint. The patient did have relief of symptoms during the anesthetic phase of the injection. Procedure, treatment alternatives, risks and benefits explained, specific risks discussed. Consent was given by the patient. Immediately prior to procedure a time out was called to verify the correct patient, procedure, equipment, support staff and site/side marked as required. Patient was prepped and draped in the usual sterile fashion.          Clinical History: No specialty comments available.     Objective:  VS:  HT:    WT:   BMI:     BP:   HR: bpm  TEMP: ( )  RESP:  Physical Exam   Imaging: No results found.

## 2023-04-15 ENCOUNTER — Encounter: Payer: Self-pay | Admitting: Physician Assistant

## 2023-04-15 ENCOUNTER — Ambulatory Visit (INDEPENDENT_AMBULATORY_CARE_PROVIDER_SITE_OTHER): Payer: BC Managed Care – PPO | Admitting: Physician Assistant

## 2023-04-15 VITALS — BP 130/80 | HR 70 | Temp 97.8°F | Ht 75.0 in | Wt 215.8 lb

## 2023-04-15 DIAGNOSIS — Z7184 Encounter for health counseling related to travel: Secondary | ICD-10-CM | POA: Diagnosis not present

## 2023-04-15 MED ORDER — CIPROFLOXACIN HCL 500 MG PO TABS: 500.0000 mg | ORAL_TABLET | Freq: Two times a day (BID) | ORAL | 0 refills | Status: AC

## 2023-04-15 MED ORDER — SILDENAFIL CITRATE 20 MG PO TABS: 20.0000 mg | ORAL_TABLET | Freq: Three times a day (TID) | ORAL | 0 refills | Status: DC

## 2023-04-15 NOTE — Progress Notes (Signed)
Subjective:    Patient ID: Terry Mcpherson, male    DOB: 02/09/1961, 62 y.o.   MRN: [id number]  Chief Complaint  Patient presents with   Medication Refill    Pt is going out of the country and wants to discuss medication for while he's away on mission trip. Only going to be away for 1 week per pt; pt hasn't filled Sildenafil in so long needs new script and sent to [facility].     Medication Refill   Patient is in today for travel advice. Heading to the [place] in June for a week long mission trip; will not be in a rural or jungle area. Planning for bug and mosquito protection.  Most concerned about possible traveler's diarrhea.  Wanting to make sure he is up to date on vaccines. Feels well overall. No recent illness or medical changes.   Past Medical History:  Diagnosis Date   Allergy    Anxiety    Chronic sinusitis    Depression    GERD (gastroesophageal reflux disease)     Past Surgical History:  Procedure Laterality Date   COLONOSCOPY     egd with dilatation  2001    Family History  Problem Relation Age of Onset   Depression Mother        sister, brother   Deep vein thrombosis Father        PE after fracture   Depression Father    Hyperlipidemia Father    Colon cancer Neg Hx     Social History   Tobacco Use   Smoking status: Never   Smokeless tobacco: Never  Substance Use Topics   Alcohol use: No    Alcohol/week: 0.0 standard drinks of alcohol   Drug use: No     Allergies  Allergen Reactions   Penicillins     hives    Review of Systems NEGATIVE UNLESS OTHERWISE INDICATED IN HPI      Objective:     BP 130/80 (BP Location: Left Arm)   Pulse 70   Temp 97.8 F (36.6 C) (Temporal)   Ht 6\' 3"  (1.905 m)   Wt 215 lb 12.8 oz (97.9 kg)   SpO2 94%   BMI 26.97 kg/m   Wt Readings from Last 3 Encounters:  04/15/23 215 lb 12.8 oz (97.9 kg)  08/05/22 213 lb 9.6 oz (96.9 kg)  08/04/21 215 lb 3.2 oz (97.6 kg)    BP Readings from Last 3 Encounters:   04/15/23 130/80  08/05/22 104/60  08/04/21 126/74     Physical Exam Vitals and nursing note reviewed.  Constitutional:      Appearance: Normal appearance.  Cardiovascular:     Rate and Rhythm: Normal rate and regular rhythm.  Pulmonary:     Effort: Pulmonary effort is normal.     Breath sounds: Normal breath sounds.  Neurological:     General: No focal deficit present.     Mental Status: He is alert and oriented to person, place, and time.  Psychiatric:        Mood and Affect: Mood normal.        Behavior: Behavior normal.        Assessment & Plan:  Travel advice encounter -     Ciprofloxacin HCl; Take 1 tablet (500 mg total) by mouth 2 (two) times daily for 10 days.  Dispense: 20 tablet; Refill: 0  Other orders -     Sildenafil Citrate; Take 1 tablet (20 mg total) by mouth  3 (three) times daily.  Dispense: 90 tablet; Refill: 81    62 year old male in overall good health going for mission trip to [place] in [place] next month. We looked at [facility] travel recommendations together today. He is UTD on vaccines except for latest COVID-19, which he is still debating about or not. Travel guidelines discussed with him per these CDC recommendations. Rx for Cipro to take in the event of traveler's diarrhea. Pt aware of risks vs benefits and possible adverse reactions. He will call if any questions or concerns.     Return if symptoms worsen or fail to improve.    Paighton Godette M Ireoluwa Gorsline, PA-C

## 2023-07-08 ENCOUNTER — Encounter (INDEPENDENT_AMBULATORY_CARE_PROVIDER_SITE_OTHER): Payer: Self-pay

## 2023-08-09 ENCOUNTER — Ambulatory Visit (INDEPENDENT_AMBULATORY_CARE_PROVIDER_SITE_OTHER): Payer: BC Managed Care – PPO | Admitting: Family Medicine

## 2023-08-09 ENCOUNTER — Encounter: Payer: Self-pay | Admitting: Family Medicine

## 2023-08-09 VITALS — BP 120/82 | HR 68 | Temp 97.3°F | Ht 75.0 in | Wt 217.2 lb

## 2023-08-09 DIAGNOSIS — Z0000 Encounter for general adult medical examination without abnormal findings: Secondary | ICD-10-CM | POA: Diagnosis not present

## 2023-08-09 DIAGNOSIS — E785 Hyperlipidemia, unspecified: Secondary | ICD-10-CM | POA: Diagnosis not present

## 2023-08-09 DIAGNOSIS — R739 Hyperglycemia, unspecified: Secondary | ICD-10-CM | POA: Diagnosis not present

## 2023-08-09 DIAGNOSIS — Z131 Encounter for screening for diabetes mellitus: Secondary | ICD-10-CM

## 2023-08-09 DIAGNOSIS — E538 Deficiency of other specified B group vitamins: Secondary | ICD-10-CM

## 2023-08-09 DIAGNOSIS — I700 Atherosclerosis of aorta: Secondary | ICD-10-CM

## 2023-08-09 DIAGNOSIS — Z125 Encounter for screening for malignant neoplasm of prostate: Secondary | ICD-10-CM | POA: Diagnosis not present

## 2023-08-09 DIAGNOSIS — H9191 Unspecified hearing loss, right ear: Secondary | ICD-10-CM

## 2023-08-09 DIAGNOSIS — F325 Major depressive disorder, single episode, in full remission: Secondary | ICD-10-CM

## 2023-08-09 LAB — CBC WITH DIFFERENTIAL/PLATELET
Basophils Absolute: 0.1 10*3/uL (ref 0.0–0.1)
Basophils Relative: 1.2 % (ref 0.0–3.0)
Eosinophils Absolute: 0.3 10*3/uL (ref 0.0–0.7)
Eosinophils Relative: 4.4 % (ref 0.0–5.0)
HCT: 45.5 % (ref 39.0–52.0)
Hemoglobin: 15 g/dL (ref 13.0–17.0)
Lymphocytes Relative: 26.3 % (ref 12.0–46.0)
Lymphs Abs: 1.6 10*3/uL (ref 0.7–4.0)
MCHC: 33 g/dL (ref 30.0–36.0)
MCV: 93.6 fl (ref 78.0–100.0)
Monocytes Absolute: 0.6 10*3/uL (ref 0.1–1.0)
Monocytes Relative: 9.5 % (ref 3.0–12.0)
Neutro Abs: 3.7 10*3/uL (ref 1.4–7.7)
Neutrophils Relative %: 58.6 % (ref 43.0–77.0)
Platelets: 323 10*3/uL (ref 150.0–400.0)
RBC: 4.86 Mil/uL (ref 4.22–5.81)
RDW: 12.8 % (ref 11.5–15.5)
WBC: 6.2 10*3/uL (ref 4.0–10.5)

## 2023-08-09 LAB — HEMOGLOBIN A1C: Hgb A1c MFr Bld: 5.8 % (ref 4.6–6.5)

## 2023-08-09 LAB — COMPREHENSIVE METABOLIC PANEL
ALT: 24 U/L (ref 0–53)
AST: 25 U/L (ref 0–37)
Albumin: 3.9 g/dL (ref 3.5–5.2)
Alkaline Phosphatase: 73 U/L (ref 39–117)
BUN: 11 mg/dL (ref 6–23)
CO2: 26 meq/L (ref 19–32)
Calcium: 9.1 mg/dL (ref 8.4–10.5)
Chloride: 107 meq/L (ref 96–112)
Creatinine, Ser: 0.8 mg/dL (ref 0.40–1.50)
GFR: 95.06 mL/min (ref >60.00)
Glucose, Bld: 95 mg/dL (ref 70–99)
Potassium: 4.1 meq/L (ref 3.5–5.1)
Sodium: 140 meq/L (ref 135–145)
Total Bilirubin: 0.6 mg/dL (ref 0.2–1.2)
Total Protein: 6.7 g/dL (ref 6.0–8.3)

## 2023-08-09 LAB — LIPID PANEL
Cholesterol: 156 mg/dL (ref 0–200)
HDL: 37.1 mg/dL — ABNORMAL LOW (ref >39.00)
LDL Cholesterol: 87 mg/dL (ref 0–99)
NonHDL: 119.13
Total CHOL/HDL Ratio: 4
Triglycerides: 163 mg/dL — ABNORMAL HIGH (ref 0.0–149.0)
VLDL: 32.6 mg/dL (ref 0.0–40.0)

## 2023-08-09 LAB — VITAMIN B12: Vitamin B-12: 479 pg/mL (ref 211–911)

## 2023-08-09 LAB — PSA: PSA: 1.19 ng/mL (ref 0.10–4.00)

## 2023-08-09 NOTE — Patient Instructions (Addendum)
Please stop by lab before you go If you have mychart- we will send your results within 3 business days of [place] receiving them.  If you do not have mychart- we will call you about results within 5 business days of [place] receiving them.  *please also note that you will see labs on mychart as soon as they post. I will later go in and write notes on them- will say "notes from Dr. Durene Cal"   We have placed a referral for you today to ENT. you will see # listed below- you can call this if you have not heard within a week.  Recommended follow up: Return in about 1 year (around 08/08/2024) for physical or sooner if needed.Schedule b4 you leave.

## 2023-08-09 NOTE — Progress Notes (Signed)
Phone: [phone]   Subjective:  Patient presents today for their annual physical. Chief complaint-noted.   See problem oriented charting- ROS- full  review of systems was completed and negative  Per full ROS sheet completed by patient  The following were reviewed and entered/updated in epic: Past Medical History:  Diagnosis Date   Allergy    Anxiety    Chronic sinusitis    Depression    GERD (gastroesophageal reflux disease)    Patient Active Problem List   Diagnosis Date Noted   BPH associated with nocturia 11/05/2017    Priority: Medium    Hearing loss of right ear 05/05/2017    Priority: Medium    Hyperglycemia 11/04/2016    Priority: Medium    Hyperlipidemia 11/04/2015    Priority: Medium    History of alcohol abuse 12/24/2014    Priority: Medium    Depression 10/07/2007    Priority: Medium    Erectile dysfunction 12/24/2014    Priority: Low   Allergic rhinitis 10/22/2010    Priority: Low   GERD 10/07/2007    Priority: Low   Aortic atherosclerosis (HCC) 08/21/2021    Priority: 2.   Major depressive disorder with single episode, in full remission (HCC) 08/05/2022   Past Surgical History:  Procedure Laterality Date   COLONOSCOPY     egd with dilatation  2001    Family History  Problem Relation Age of Onset   Depression Mother        sister, brother   Deep vein thrombosis Father        PE after fracture   Depression Father    Hyperlipidemia Father    Colon cancer Neg Hx     Medications- reviewed and updated Current Outpatient Medications  Medication Sig Dispense Refill   omeprazole (PRILOSEC) 20 MG capsule Take 20 mg by mouth daily.     rosuvastatin (CRESTOR) 5 MG tablet TAKE 1 TABLET (5 MG TOTAL) BY MOUTH DAILY. 90 tablet 3   sertraline (ZOLOFT) 100 MG tablet TAKE 1 AND 1/2 TABLETS BY MOUTH EVERY DAY 135 tablet 3   sildenafil (REVATIO) 20 MG tablet Take 1 tablet (20 mg total) by mouth 3 (three) times daily. 90 tablet 0   No current  facility-administered medications for this visit.    Allergies-reviewed and updated Allergies  Allergen Reactions   Penicillins     hives    Social History   Social History Narrative   Married. 3 children, 2 step kids. No grandkids.       Semi retired- cut back more substantially in 2024. Doing more acting in 2024.    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 Oropharynx normal Neck: no thyromegaly CV: RRR no murmurs rubs or gallops Lungs: CTAB no crackles, wheeze, rhonchi Abdomen: soft/nontender/nondistended/normal bowel sounds. No rebound or guarding.  Ext: no edema Skin: warm, dry Neuro: grossly normal, moves all extremities, PERRLA   Assessment and Plan  62 y.o. male presenting for annual physical.  Health Maintenance counseling: 1. Anticipatory guidance: Patient counseled regarding regular dental exams -q6 months,  eye exams -yearly with [facility],  avoiding smoking and second hand smoke , limiting alcohol to 2 beverages per day - over 20 years alcohol free and still in AA (22 this year), no illicit drugs .   2. Risk factor reduction:  Advised patient of need for regular exercise and diet rich and fruits and vegetables to reduce risk of heart attack and stroke.  Exercise- at [employer] 3 days a week (weight circuits)  and walking dogs- twice a day. [place] pickleball at [place].  Diet/weight management- weight within 2 lbs of last year but he's still interested at getting down in 200-210 range.  Wt Readings from Last 3 Encounters:  08/09/23 217 lb 3.2 oz (98.5 kg)  04/15/23 215 lb 12.8 oz (97.9 kg)  08/05/22 213 lb 9.6 oz (96.9 kg)  3.  Immunizations/screenings/ancillary studies- already had flu and COVID shots  Immunization History  Administered Date(s) Administered   COVID-19, mRNA, vaccine(Comirnaty)12 years and older 07/23/2023   Hepatitis A, Adult 05/13/2021   Influenza Inj Mdck Quad Pf 07/24/2019   Influenza Whole 10/07/2007   Influenza, Mdck, Trivalent,PF 6+ MOS(egg free) 07/23/2023   Influenza,inj,Quad PF,6+ Mos 11/04/2016, 08/08/2019, 09/05/2020, 08/04/2021, 08/05/2022   Influenza-Unspecified 07/20/2017, 08/23/2018   PFIZER Comirnaty(Gray Top)Covid-19 Tri-Sucrose Vaccine 03/26/2021   PFIZER(Purple Top)SARS-COV-2 Vaccination 02/04/2020, 02/28/2020, 10/16/2020   Pfizer Covid-19 Vaccine Bivalent Booster 9yrs & up 09/25/2021   Td 06/30/2010, 05/09/2021   Typhoid Inactivated 05/13/2021   Zoster Recombinant(Shingrix) 12/08/2018, 03/22/2019  4. Prostate cancer screening-  low risk prior trend- update psa today  Lab Results  Component Value Date   PSA 1.29 08/05/2022   PSA 1.01 08/04/2021   PSA 1.02 12/15/2019   5. Colon cancer screening - normal colonoscopy 09/25/2014 with repeat in 10 years planned 6. Skin cancer screening-referred to dermatology 2 years ago- [facility] and had skin check that was reassuring.   advised regular sunscreen use. Denies worrisome, changing, or new skin lesions.  7. Smoking associated screening (lung cancer screening, AAA screen 65-75, UA)-never smoker 8. STD screening -only active with wife/declines   Status of chronic or acute concerns   #social update- mission trip to [place] went well but was hard  #hyperlipidemia- ct cardiac scoring 37.9 in 08/21/21 53% for age- started statin #Aortic atherosclerosis S: Medication:Rosuvastatin 5 mg daily- no side effects Lab Results  Component Value Date   CHOL 161 08/05/2022   HDL 38.60 (L) 08/05/2022   LDLCALC 93 08/05/2022   LDLDIRECT 149.0 12/08/2018   TRIG 145.0 08/05/2022   CHOLHDL 4 08/05/2022  A/P: Low Density Lipoprotein (LDL  cholesterol) reasonably controlled- could push for under 70 - we discussed repeating CT calcium score in 2027 to reevaluate Aortic atherosclerosis (presumed stable)- LDL goal ideally <70 - ok to work slowly towards this with lifestyle  # Depression S: Medication: sertraline 150 mg. A/P: full remission- team to enter phq9- continue current medications      # Hyperglycemia/insulin resistance/prediabetes-but thankfully A1c not elevated S:  Medication: none Lab Results  Component Value Date   HGBA1C 5.7 08/05/2022   HGBA1C 5.6 08/04/2021   HGBA1C 5.6 12/08/2018  A/P: hopefully stable- update a1c today. Continue without meds for now    #Erectile dysfunction-sildenafil helpful   #GERD-Prilosec 20 mg helpful-Pepcid trial did not help in the past  #Relative B12 deficiency-recommended B12 last year-patient reports on B12 daily 1000 mcg  Lab Results  Component Value Date   VITAMINB12 290 08/05/2022   #Hearing loss of right ear-referred back  to ENT last year-patient reports did not see them- wants new referral   #Neck and shoulder issues- prior injections most recently as march- better with exercise  Recommended follow up: Return in about 1 year (around 08/08/2024) for physical or sooner if needed.Schedule b4 you leave. No future appointments.  Lab/Order associations: fasting   ICD-10-CM   1. Preventative health care  Z00.00     2. Hyperlipidemia, unspecified hyperlipidemia type  E78.5 CBC with Differential/Platelet    Comprehensive metabolic panel    Lipid panel    3. Hyperglycemia  R73.9 HgB A1c    4. Screening for diabetes mellitus  Z13.1 HgB A1c    5. Screening for prostate cancer  Z12.5 PSA    6. Major depressive disorder with single episode, in full remission (HCC)  F32.5     7. B12 deficiency  E53.8 Vitamin B12    8. Hearing loss of right ear, unspecified hearing loss type  H91.91 Ambulatory referral to ENT    9. Aortic atherosclerosis (HCC) Chronic I70.0       No  orders of the defined types were placed in this encounter.   Return precautions advised.  [name], MD

## 2023-08-12 ENCOUNTER — Other Ambulatory Visit: Payer: Self-pay

## 2023-08-12 DIAGNOSIS — H9191 Unspecified hearing loss, right ear: Secondary | ICD-10-CM

## 2023-08-25 ENCOUNTER — Other Ambulatory Visit: Payer: Self-pay | Admitting: Family Medicine

## 2023-10-11 ENCOUNTER — Other Ambulatory Visit: Payer: Self-pay | Admitting: Physician Assistant

## 2023-10-26 ENCOUNTER — Telehealth: Payer: Self-pay | Admitting: Physical Medicine and Rehabilitation

## 2023-10-26 NOTE — Telephone Encounter (Signed)
Patient called to schedule an appointment some steroid inj in the right shoulder. CB#[phone]

## 2023-10-27 ENCOUNTER — Other Ambulatory Visit: Payer: Self-pay | Admitting: Physical Medicine and Rehabilitation

## 2023-10-27 DIAGNOSIS — G8929 Other chronic pain: Secondary | ICD-10-CM

## 2023-11-03 ENCOUNTER — Ambulatory Visit (INDEPENDENT_AMBULATORY_CARE_PROVIDER_SITE_OTHER): Payer: PRIVATE HEALTH INSURANCE | Admitting: Physical Medicine and Rehabilitation

## 2023-11-03 ENCOUNTER — Other Ambulatory Visit: Payer: Self-pay

## 2023-11-03 DIAGNOSIS — M25511 Pain in right shoulder: Secondary | ICD-10-CM | POA: Diagnosis not present

## 2023-11-03 DIAGNOSIS — G8929 Other chronic pain: Secondary | ICD-10-CM | POA: Diagnosis not present

## 2023-11-03 MED ORDER — TRIAMCINOLONE ACETONIDE 40 MG/ML IJ SUSP
40.0000 mg | INTRAMUSCULAR | Status: AC | PRN
  Administered 2023-11-03: 40 mg via INTRA_ARTICULAR

## 2023-11-03 MED ORDER — BUPIVACAINE HCL 0.25 % IJ SOLN
5.0000 mL | INTRAMUSCULAR | Status: AC | PRN
  Administered 2023-11-03: 5 mL via INTRA_ARTICULAR

## 2023-11-03 NOTE — Progress Notes (Signed)
   Terry Mcpherson - 62 y.o. male MRN [id number]  Date of birth: May 13, 1961  Office Visit Note: Visit Date: 11/03/2023 PCP: Shelva Majestic, MD Referred by: Shelva Majestic, MD  Subjective: Chief Complaint  Patient presents with   Right Shoulder - Pain   HPI:  Terry Mcpherson is a 62 y.o. male who comes in today at the request of Ellin Goodie, FNP for planned Right anesthetic glenohumeral arthrogram with fluoroscopic guidance.  The patient has failed conservative care including home exercise, medications, time and activity modification.  This injection will be diagnostic and hopefully therapeutic.  Please see requesting physician notes for further details and justification.   ROS Otherwise per HPI.  Assessment & Plan: Visit Diagnoses:    ICD-10-CM   1. Chronic right shoulder pain  M25.511 XR C-ARM NO REPORT   G89.29       Plan: No additional findings.   Meds & Orders: No orders of the defined types were placed in this encounter.   Orders Placed This Encounter  Procedures   Large Joint Inj: R glenohumeral   XR C-ARM NO REPORT    Follow-up: Return for visit to requesting provider as needed.   Procedures: Large Joint Inj: R glenohumeral on 11/03/2023 4:19 PM Indications: pain and diagnostic evaluation Details: 22 G 3.5 in needle, fluoroscopy-guided anteromedial approach  Arthrogram: No  Medications: 40 mg triamcinolone acetonide 40 MG/ML; 5 mL bupivacaine 0.25 % Outcome: tolerated well, no immediate complications  There was excellent flow of contrast producing a partial arthrogram of the glenohumeral joint. The patient did have relief of symptoms during the anesthetic phase of the injection. Procedure, treatment alternatives, risks and benefits explained, specific risks discussed. Consent was given by the patient. Immediately prior to procedure a time out was called to verify the correct patient, procedure, equipment, support staff and site/side marked as required.  Patient was prepped and draped in the usual sterile fashion.          Clinical History: No specialty comments available.     Objective:  VS:  HT:    WT:   BMI:     BP:   HR: bpm  TEMP: ( )  RESP:  Physical Exam   Imaging: No results found.

## 2023-12-13 ENCOUNTER — Encounter: Payer: Self-pay | Admitting: Physical Medicine and Rehabilitation

## 2024-01-10 ENCOUNTER — Ambulatory Visit (INDEPENDENT_AMBULATORY_CARE_PROVIDER_SITE_OTHER): Payer: PRIVATE HEALTH INSURANCE | Admitting: Orthopaedic Surgery

## 2024-01-10 ENCOUNTER — Other Ambulatory Visit (INDEPENDENT_AMBULATORY_CARE_PROVIDER_SITE_OTHER): Payer: Self-pay

## 2024-01-10 ENCOUNTER — Encounter: Payer: Self-pay | Admitting: Orthopaedic Surgery

## 2024-01-10 DIAGNOSIS — G8929 Other chronic pain: Secondary | ICD-10-CM

## 2024-01-10 DIAGNOSIS — M25511 Pain in right shoulder: Secondary | ICD-10-CM

## 2024-01-10 DIAGNOSIS — M25562 Pain in left knee: Secondary | ICD-10-CM | POA: Diagnosis not present

## 2024-01-10 MED ORDER — METHYLPREDNISOLONE ACETATE 40 MG/ML IJ SUSP
40.0000 mg | INTRAMUSCULAR | Status: AC | PRN
Start: 2024-01-10 — End: 2024-01-10
  Administered 2024-01-10: 40 mg via INTRA_ARTICULAR

## 2024-01-10 MED ORDER — LIDOCAINE HCL 1 % IJ SOLN
3.0000 mL | INTRAMUSCULAR | Status: AC | PRN
  Administered 2024-01-10: 3 mL

## 2024-01-10 NOTE — Progress Notes (Signed)
 The patient is well-known to me.  He last had a steroid injection in his right shoulder joint under fluoroscopy by Dr. Alvester Morin in December.  He is 63 years old and very active.  He has a ski trip coming up so he would like to have a steroid injection in his left knee today.  With never x-rayed his left knee but he has a harder time going up and down stairs with no known injury.  He said the shoulder is doing okay and he knows to wait at least 3 months between steroid injections.  He needed a follow-up appoint with [place] since we had not seen him in a while for his right shoulder just to make sure the shoulder is doing well.  On exam he still has some limitations with overhead reaching and abduction with the right shoulder but there is really no blocks to rotation.  He does have the same pain at the glenohumeral joint.  A single AP view of the right shoulder shows that the arthritis has not progressed when compared to films from 2 years ago.  There is glenohumeral arthritic changes noted.  Examination of his left knee shows no effusion with pain throughout arc of motion of his knee with some minimal patellofemoral crepitation.  There is no instability on exam.  We did not x-ray his knee today.  Since he is going on a ski trip I did not mind placing a steroid injection in his left knee and he agreed to this and tolerated it well.  All question concerns were answered and addressed.  Follow-up can be as needed.     Procedure Note  Patient: Terry Mcpherson             Date of Birth: Sep 23, 1961           MRN: [id number]             Visit Date: 01/10/2024  Procedures: Visit Diagnoses:  1. Chronic right shoulder pain   2. Chronic pain of left knee     Large Joint Inj: L knee on 01/10/2024 2:04 PM Indications: diagnostic evaluation and pain Details: 22 G 1.5 in needle, superolateral approach  Arthrogram: No  Medications: 3 mL lidocaine 1 %; 40 mg methylPREDNISolone acetate 40 MG/ML Outcome: tolerated  well, no immediate complications Procedure, treatment alternatives, risks and benefits explained, specific risks discussed. Consent was given by the patient. Immediately prior to procedure a time out was called to verify the correct patient, procedure, equipment, support staff and site/side marked as required. Patient was prepped and draped in the usual sterile fashion.

## 2024-01-18 ENCOUNTER — Encounter: Payer: Self-pay | Admitting: Physical Medicine and Rehabilitation

## 2024-01-18 ENCOUNTER — Other Ambulatory Visit: Payer: Self-pay | Admitting: Physical Medicine and Rehabilitation

## 2024-01-18 DIAGNOSIS — G8929 Other chronic pain: Secondary | ICD-10-CM

## 2024-01-27 ENCOUNTER — Other Ambulatory Visit: Payer: Self-pay

## 2024-01-27 ENCOUNTER — Ambulatory Visit (INDEPENDENT_AMBULATORY_CARE_PROVIDER_SITE_OTHER): Payer: PRIVATE HEALTH INSURANCE | Admitting: Physical Medicine and Rehabilitation

## 2024-01-27 VITALS — BP 136/88 | HR 38

## 2024-01-27 DIAGNOSIS — M25511 Pain in right shoulder: Secondary | ICD-10-CM

## 2024-01-27 DIAGNOSIS — M25512 Pain in left shoulder: Secondary | ICD-10-CM

## 2024-01-27 DIAGNOSIS — G8929 Other chronic pain: Secondary | ICD-10-CM

## 2024-01-27 MED ORDER — TRIAMCINOLONE ACETONIDE 40 MG/ML IJ SUSP
40.0000 mg | INTRAMUSCULAR | Status: AC | PRN
  Administered 2024-01-27: 40 mg via INTRA_ARTICULAR

## 2024-01-27 MED ORDER — BUPIVACAINE HCL 0.25 % IJ SOLN
5.0000 mL | INTRAMUSCULAR | Status: AC | PRN
  Administered 2024-01-27: 5 mL via INTRA_ARTICULAR

## 2024-01-27 MED ORDER — BUPIVACAINE HCL 0.25 % IJ SOLN
5.0000 mL | INTRAMUSCULAR | Status: AC | PRN
Start: 2024-01-27 — End: 2024-01-27
  Administered 2024-01-27: 5 mL via INTRA_ARTICULAR

## 2024-01-27 NOTE — Progress Notes (Signed)
 Pain Scale   Average Pain 4

## 2024-01-27 NOTE — Progress Notes (Signed)
   Terry Mcpherson - 63 y.o. male MRN [id number]  Date of birth: [birth year]  Office Visit Note: Visit Date: 01/27/2024 PCP: Shelva Majestic, MD Referred by: Shelva Majestic, MD  Subjective: Chief Complaint  Patient presents with   Right Shoulder - Pain   Left Shoulder - Pain   HPI:  Terry Mcpherson is a 63 y.o. male who comes in today at the request of Dr. Doneen Poisson for planned Bilateral anesthetic glenohumeral arthrogram with fluoroscopic guidance.  The patient has failed conservative care including home exercise, medications, time and activity modification.  This injection will be diagnostic and hopefully therapeutic.  Please see requesting physician notes for further details and justification.    ROS Otherwise per HPI.  Assessment & Plan: Visit Diagnoses:    ICD-10-CM   1. Chronic pain of both shoulders  M25.511 XR C-ARM NO REPORT   G89.29    M25.512       Plan: No additional findings.   Meds & Orders: No orders of the defined types were placed in this encounter.   Orders Placed This Encounter  Procedures   Large Joint Inj   XR C-ARM NO REPORT    Follow-up: Return for visit to requesting provider as needed.   Procedures: Large Joint Inj: bilateral glenohumeral on 01/27/2024 2:39 PM Indications: pain and diagnostic evaluation Details: 22 G 3.5 in needle, fluoroscopy-guided anteromedial approach  Arthrogram: No  Medications (Right): 40 mg triamcinolone acetonide 40 MG/ML; 5 mL bupivacaine 0.25 % Medications (Left): 40 mg triamcinolone acetonide 40 MG/ML; 5 mL bupivacaine 0.25 % Outcome: tolerated well, no immediate complications  There was excellent flow of contrast producing a partial arthrogram of the glenohumeral joint. The patient did have relief of symptoms during the anesthetic phase of the injection. Procedure, treatment alternatives, risks and benefits explained, specific risks discussed. Consent was given by the patient. Immediately prior to  procedure a time out was called to verify the correct patient, procedure, equipment, support staff and site/side marked as required. Patient was prepped and draped in the usual sterile fashion.          Clinical History: No specialty comments available.     Objective:  VS:  HT:    WT:   BMI:     BP:136/88  HR:(!) 38bpm  TEMP: ( )  RESP:  Physical Exam   Imaging: No results found.

## 2024-02-18 ENCOUNTER — Telehealth (INDEPENDENT_AMBULATORY_CARE_PROVIDER_SITE_OTHER): Payer: PRIVATE HEALTH INSURANCE | Admitting: Family Medicine

## 2024-02-18 ENCOUNTER — Encounter: Payer: Self-pay | Admitting: Family Medicine

## 2024-02-18 VITALS — Ht 75.0 in | Wt 212.0 lb

## 2024-02-18 DIAGNOSIS — I700 Atherosclerosis of aorta: Secondary | ICD-10-CM

## 2024-02-18 DIAGNOSIS — E785 Hyperlipidemia, unspecified: Secondary | ICD-10-CM

## 2024-02-18 DIAGNOSIS — K219 Gastro-esophageal reflux disease without esophagitis: Secondary | ICD-10-CM | POA: Diagnosis not present

## 2024-02-18 DIAGNOSIS — R1310 Dysphagia, unspecified: Secondary | ICD-10-CM | POA: Diagnosis not present

## 2024-02-18 DIAGNOSIS — R7303 Prediabetes: Secondary | ICD-10-CM

## 2024-02-18 NOTE — Progress Notes (Signed)
 Phone [phone] Virtual visit via Video note   Subjective:  Chief complaint: Chief Complaint  Patient presents with   trouble swallowing    Our team/I connected with Terry Mcpherson at 10:00 AM EDT by a video enabled telemedicine application (caregility through epic) and verified that I am speaking with the correct person using two identifiers. Our team/I discussed the limitations of evaluation and management by telemedicine and the availability of in person appointments.No physical exam was performed (except for noted visual exam or audio findings with Telehealth visits).   Location patient: [place] Location provider: [facility], office Persons participating in the virtual visit:  patient  Past Medical History-  Patient Active Problem List   Diagnosis Date Noted   BPH associated with nocturia 11/05/2017    Priority: Medium    Hearing loss of right ear 05/05/2017    Priority: Medium    Hyperglycemia 11/04/2016    Priority: Medium    Hyperlipidemia 11/04/2015    Priority: Medium    History of alcohol abuse 12/24/2014    Priority: Medium    Depression 10/07/2007    Priority: Medium    Erectile dysfunction 12/24/2014    Priority: Low   Allergic rhinitis 10/22/2010    Priority: Low   GERD 10/07/2007    Priority: Low   Aortic atherosclerosis (HCC) 08/21/2021    Priority: 2.   Major depressive disorder with single episode, in full remission (HCC) 08/05/2022    Medications- reviewed and updated Current Outpatient Medications  Medication Sig Dispense Refill   omeprazole (PRILOSEC) 20 MG capsule Take 20 mg by mouth daily.     rosuvastatin (CRESTOR) 5 MG tablet TAKE 1 TABLET (5 MG TOTAL) BY MOUTH DAILY. 90 tablet 3   sertraline (ZOLOFT) 100 MG tablet TAKE 1 AND 1/2 TABLETS DAILY BY MOUTH 135 tablet 3   sildenafil (REVATIO) 20 MG tablet TAKE ONE TABLET BY MOUTH THREE TIMES DAILY 90 tablet 0   No current facility-administered medications for this visit.     Objective:   Ht 6\' 3"  (1.905 m)   Wt 212 lb (96.2 kg)   BMI 26.50 kg/m  self reported vitals Gen: NAD, resting comfortably Lungs: nonlabored, normal respiratory rate  Skin: appears dry, no obvious rash     Assessment and Plan   #Dysphagia in patient with history of GERD- has seen Dr. Evette Cristal in past S: medication: Prilosec 20 mg helpful and was started on this around 2015 (had considered endoscopy but got better)-Pepcid trial did not help in the past  patient reports history of difficulty swallowing 20-30 years ago and had a balloon for Schatzki's ring. Seemed to do well after stopping alcohol and has not had issues for many years.  - bread feels like gets stuck at times. Rice and proteins are a problem as well - no nausea or vomiting. No melena or bright red blood per rectum.   A/P: Dysphagia patient with history of GERD as well as history of Schatzki's ring with need for dilation-will place referral to GI-previously seen by [facility] but his provider has retired and he would like to see someone within our network-referral to [facility] was placed and he will call within a week if he does not hear We are going to try omeprazole twice daily before meals to see if this helps while waiting on GI referral  #hyperlipidemia- ct cardiac scoring 37.9 in 08/21/21 53% for age- started statin #Aortic atherosclerosis S: Medication:Rosuvastatin 5 mg daily- no side effects Lab Results  Component Value Date   CHOL 156 08/09/2023   HDL 37.10 (L) 08/09/2023   LDLCALC 87 08/09/2023   LDLDIRECT 149.0 12/08/2018   TRIG 163.0 (H) 08/09/2023   CHOLHDL 4 08/09/2023   A/P: Lipids above ideal goal with LDL at 87 and preference to be under 70 with aortic atherosclerosis-continue current medication but patient wants to work on healthy eating/or exercise/weight loss to get this down     # Hyperglycemia/insulin resistance/prediabetes-but thankfully A1c not elevated S:  Medication: None Exercise and diet- down a few lbs-  walking dogs and gym 3x a week Lab Results  Component Value Date   HGBA1C 5.8 08/09/2023   HGBA1C 5.7 08/05/2022   HGBA1C 5.6 08/04/2021  A/P: Prediabetes hopefully improving with weight loss and regular exercise-recheck at physical   Recommended follow up: Return for next already scheduled visit or sooner if needed. Future Appointments  Date Time Provider Department Center  08/10/2024  9:20 AM Shelva Majestic, MD [facility]    Lab/Order associations:   ICD-10-CM   1. Gastroesophageal reflux disease without esophagitis  K21.9 Ambulatory referral to Gastroenterology    2. Dysphagia, unspecified type  R13.10 Ambulatory referral to Gastroenterology    3. Hyperlipidemia, unspecified hyperlipidemia type  E78.5     4. Aortic atherosclerosis (HCC)  I70.0     5. Prediabetes  R73.03       No orders of the defined types were placed in this encounter.   Return precautions advised.  [name], MD

## 2024-02-18 NOTE — Patient Instructions (Addendum)
 [facility] contact Please call to schedule visit and/or procedure IF you do not hear within a week Address: [address] [place], [place], [place] [address] Phone: [phone]   We are going to try omeprazole twice daily before meals to see if this helps while waiting on GI referral  Recommended follow up: Return for next already scheduled visit or sooner if needed.

## 2024-02-29 ENCOUNTER — Encounter: Payer: Self-pay | Admitting: Nurse Practitioner

## 2024-03-13 ENCOUNTER — Encounter: Payer: Self-pay | Admitting: Nurse Practitioner

## 2024-03-13 ENCOUNTER — Ambulatory Visit (INDEPENDENT_AMBULATORY_CARE_PROVIDER_SITE_OTHER): Payer: PRIVATE HEALTH INSURANCE | Admitting: Nurse Practitioner

## 2024-03-13 VITALS — BP 130/78 | HR 79 | Ht 75.0 in | Wt 213.8 lb

## 2024-03-13 DIAGNOSIS — R143 Flatulence: Secondary | ICD-10-CM

## 2024-03-13 DIAGNOSIS — R1310 Dysphagia, unspecified: Secondary | ICD-10-CM

## 2024-03-13 NOTE — Progress Notes (Signed)
 03/13/2024 Terry Mcpherson [id number] [birth year]   CHIEF COMPLAINT: Difficulty swallowing   HISTORY OF PRESENT ILLNESS: Terry Mcpherson is a 63 year old male with a past medical history of GERD, dysphagia secondary to having a Schatzki's ring status post dilatation 20+ years ago, hyperlipidemia, prediabetes and alcohol use disorder (abstinent from alcohol x 22 years). Previously known by Dr. Howard Macho. He presents to our office today as referred by Dr. Clarisa Crooked for further evaluation regarding difficulty swallowing solid foods and GERD. He describes having food such as bread or meat which gets stuck to the mid esophagus and passes after he waits a few seconds and takes a few sips of water which occurs with each meal for the past 6 weeks. No active reflux symptoms. No upper or lower abdominal pain. He is passing normal formed brown stool daily. No bloody or black stools. He endorses passing increased gas per the rectum. He takes Ibuprofen 200 mg 2 tabs nightly as needed for 1 to 2 weeks and does not take it for a few weeks.He underwent an EGD while living in [place]  20 years ago which initially identified a Schatzki's ring which was dilated and he underwent a second EGD in the early 2000's. He was seen by Dr. Elsie Halo with [facility] 10 to 15 years ago and was prescribed Omeprazole 20mg  every day and for GERD symptoms which he remains on since then. He underwent a colonoscopy by Dr. Howard Macho 09/25/2014 which was normal.  No known family history of colon polyps or colorectal cancer.      Latest Ref Rng & Units 08/09/2023    9:45 AM 08/05/2022   11:02 AM 08/04/2021   10:19 AM  CBC  WBC 4.0 - 10.5 K/uL 6.2  6.9  6.8   Hemoglobin 13.0 - 17.0 g/dL 78.2  95.6  21.3   Hematocrit 39.0 - 52.0 % 45.5  44.3  45.3   Platelets 150.0 - 400.0 K/uL 323.0  306.0  330.0        Latest Ref Rng & Units 08/09/2023    9:45 AM 08/05/2022   11:02 AM 08/04/2021   10:19 AM  CMP  Glucose 70 - 99 mg/dL 95  90  086    BUN 6 - 23 mg/dL 11  11  14    Creatinine 0.40 - 1.50 mg/dL 5.78  4.69  6.29   Sodium 135 - 145 mEq/L 140  139  138   Potassium 3.5 - 5.1 mEq/L 4.1  4.0  4.2   Chloride 96 - 112 mEq/L 107  104  104   CO2 19 - 32 mEq/L 26  28  26    Calcium  8.4 - 10.5 mg/dL 9.1  9.4  9.5   Total Protein 6.0 - 8.3 g/dL 6.7  7.1  6.9   Total Bilirubin 0.2 - 1.2 mg/dL 0.6  0.6  0.5   Alkaline Phos 39 - 117 U/L 73  60  65   AST 0 - 37 U/L 25  23  17    ALT 0 - 53 U/L 24  26  19       PAST GI PROCEDURES:  Colonoscopy 09/25/2014 by Dr. Howard Macho: 1. Mild diverticulosis was noted in the left colon  2. The examination was otherwise normal; no polyps or cancers 3. Recall colonoscopy 10 years   Past Medical History:  Diagnosis Date   Allergy    Anxiety    Chronic sinusitis    Depression    GERD (gastroesophageal reflux disease)  Past Surgical History:  Procedure Laterality Date   COLONOSCOPY     egd with dilatation  2001   Social History: He is retired.  He has 1 son and 2 daughters.  Non-smoker.  Abstinent from alcohol x 22 years.  No drug use.  Family History: Father with depression, DVT, hyperlipidemia and hypertension.  Mother with history of depression.  No known family history of esophageal, gastric or colon cancer.  Allergies  Allergen Reactions   Penicillins     hives      Outpatient Encounter Medications as of 03/13/2024  Medication Sig   omeprazole (PRILOSEC) 20 MG capsule Take 20 mg by mouth daily.   rosuvastatin  (CRESTOR ) 5 MG tablet TAKE 1 TABLET (5 MG TOTAL) BY MOUTH DAILY.   sertraline  (ZOLOFT ) 100 MG tablet TAKE 1 AND 1/2 TABLETS DAILY BY MOUTH   sildenafil  (REVATIO ) 20 MG tablet TAKE ONE TABLET BY MOUTH THREE TIMES DAILY   No facility-administered encounter medications on file as of 03/13/2024.   REVIEW OF SYSTEMS:  Gen: Denies fever, sweats or chills. No weight loss.  CV: Denies chest pain, palpitations or edema. Resp: Denies cough, shortness of breath of hemoptysis.  GI:  See HPI.  GU: Denies urinary burning, blood in urine, increased urinary frequency or incontinence. MS:+ Right shoulder pain. Derm: Denies rash, itchiness, skin lesions or unhealing ulcers. Psych: Denies depression, anxiety, memory loss or confusion. Heme: Denies bruising, easy bleeding. Neuro:  Denies headaches, dizziness or paresthesias. Endo:  Denies any problems with DM, thyroid  or adrenal function.  PHYSICAL EXAM: BP 130/78   Pulse 79   Ht 6\' 3"  (1.905 m)   Wt 213 lb 12.8 oz (97 kg)   SpO2 94%   BMI 26.72 kg/m  General:63 year old male in no acute distress. Head: Normocephalic and atraumatic. Eyes:  Sclerae non-icteric, conjunctive pink. Ears: Normal auditory acuity. Mouth: Dentition intact. No ulcers or lesions.  Neck: Supple, no lymphadenopathy or thyromegaly.  Lungs: Clear bilaterally to auscultation without wheezes, crackles or rhonchi. Heart: Regular rate and rhythm. No murmur, rub or gallop appreciated.  Abdomen: Soft, nontender, nondistended. No masses. No hepatosplenomegaly. Normoactive bowel sounds x 4 quadrants.  Rectal: Deferred. Musculoskeletal: Symmetrical with no gross deformities. Skin: Warm and dry. No rash or lesions on visible extremities. Extremities: No edema. Neurological: Alert oriented x 4, no focal deficits.  Psychological:  Alert and cooperative. Normal mood and affect.  ASSESSMENT AND PLAN:  63 year old male with a history of GERD and reported Schatzki's ring status post dilatation 20 years ago presents with recurrent esophageal dysphagia with solid foods x 6 weeks. - Continue Omeprazole 20 mg daily - Avoid eating large pieces of bread and meat - Take 3 separate sips of water prior to swallowing any food or medications, cut food into small pieces and chew food thoroughly -EGD with possible esophageal dilatation benefits and risks discussed including risk with sedation, risk of bleeding, perforation and infection  - Patient to contact office if  symptoms worsen and to go to the ED if food becomes stuck in the esophagus and does not pass  Increased flatulence - MiraLAX 1 capful mixed with 8 ounces of water or clear liquid of choice nightly to increase stool output - Avoid dairy products or take Lactaid 1-2 tabs with each dairy product  Colon cancer screening.  Normal colonoscopy 09/2014: - Next colonoscopy due 09/2024, recall previously entered       CC:  Almira Jaeger, MD

## 2024-03-13 NOTE — Progress Notes (Signed)
 Noted.

## 2024-03-13 NOTE — Patient Instructions (Addendum)
 You have been scheduled for an endoscopy. Please follow written instructions given to you at your visit today.  If you use inhalers (even only as needed), please bring them with you on the day of your procedure.  If you take any of the following medications, they will need to be adjusted prior to your procedure:   DO NOT TAKE 7 DAYS PRIOR TO TEST- Trulicity (dulaglutide) Ozempic, Wegovy (semaglutide) Mounjaro (tirzepatide) Bydureon Bcise (exanatide extended release)  DO NOT TAKE 1 DAY PRIOR TO YOUR TEST Rybelsus (semaglutide) Adlyxin (lixisenatide) Victoza (liraglutide) Byetta (exanatide) ___________________________________________________________________________  Avoid eating large pieces of bread and meat.  Recall colonoscopy due November 2025.  Due to recent changes in healthcare laws, you may see the results of your imaging and laboratory studies on MyChart before your provider has had a chance to review them.  We understand that in some cases there may be results that are confusing or concerning to you. Not all laboratory results come back in the same time frame and the provider may be waiting for multiple results in order to interpret others.  Please give us  48 hours in order for your provider to thoroughly review all the results before contacting the office for clarification of your results.   Thank you for trusting me with your gastrointestinal care!   Everett Hitt, CRNP

## 2024-04-19 ENCOUNTER — Encounter: Payer: PRIVATE HEALTH INSURANCE | Admitting: Internal Medicine

## 2024-04-20 ENCOUNTER — Ambulatory Visit (INDEPENDENT_AMBULATORY_CARE_PROVIDER_SITE_OTHER): Admitting: Family Medicine

## 2024-04-20 ENCOUNTER — Encounter: Payer: Self-pay | Admitting: Family Medicine

## 2024-04-20 VITALS — BP 127/77 | HR 86 | Temp 97.9°F | Ht 75.0 in | Wt 213.8 lb

## 2024-04-20 DIAGNOSIS — K219 Gastro-esophageal reflux disease without esophagitis: Secondary | ICD-10-CM | POA: Diagnosis not present

## 2024-04-20 DIAGNOSIS — R21 Rash and other nonspecific skin eruption: Secondary | ICD-10-CM

## 2024-04-20 MED ORDER — PREDNISONE 10 MG PO TABS: ORAL_TABLET | ORAL | 0 refills | Status: AC

## 2024-04-20 NOTE — Progress Notes (Signed)
   Terry Mcpherson is a 63 y.o. male who presents today for an office visit.  Assessment/Plan:  New/Acute Problems: Rash  No red flags.  Concern for poison ivy contact dermatitis despite no direct exposure. Patient states that his current rash is similar to the last time he was exposed to poison ivy.  It is possible this may represent tinea versicolor as well however this is less likely.  We discussed treatment options.  Given widespread nature would be reasonable for us  to start systemic steroids at this point.  He has done well with these in the past.  Will start prednisone  taper for the next 2 weeks.  It is okay for him to use topical cortisone cream.  He can also use over-the-counter antihistamines as needed as well.  He will let us  know if not improving in the next 1 to 2 weeks.  If no improvement would consider empiric treatment with topical antifungal such as ketoconazole.  Chronic Problems Addressed Today: GERD Recently increased omeprazole to 20 mg twice daily and is doing much better with this.  He was scheduled for EGD yesterday this was postponed due to improvement in his symptoms.  Will defer further management to gastroenterology.     Subjective:  HPI:  See A/P for status of chronic conditions.  Patient is here today with rash.  Started a few days ago.  Located on abdomen and torso.  He is concerned about potential poison ivy exposure.  He has not been doing any outside work but believes that his dogs may have been in contact with poison ivy.  He has not had a lot of itching there.  Worse after recent heat exposure as well.  He has tried some OTC topical treatments without much improvement.  He had this several years ago and received a steroid injection which helped.       Objective:  Physical Exam: Ht 6\' 3"  (1.905 m)   BMI 26.72 kg/m   Gen: No acute distress, resting comfortably Skin: Erythematous well-demarcated rash on right flank, abdomen, and lower abdomen. Neuro:  Grossly normal, moves all extremities Psych: Normal affect and thought content      Terry Mcpherson M. Daneil Dunker, MD 04/20/2024 1:48 PM

## 2024-04-20 NOTE — Patient Instructions (Signed)
 It was very nice to see you today!  Please start the prednisone . Let me know if not improving.   Return if symptoms worsen or fail to improve.   Take care, Dr Daneil Dunker  PLEASE NOTE:  If you had any lab tests, please let us  know if you have not heard back within a few days. You may see your results on mychart before we have a chance to review them but we will give you a call once they are reviewed by us .   If we ordered any referrals today, please let us  know if you have not heard from their office within the next week.   If you had any urgent prescriptions sent in today, please check with the pharmacy within an hour of our visit to make sure the prescription was transmitted appropriately.   Please try these tips to maintain a healthy lifestyle:  Eat at least 3 REAL meals and 1-2 snacks per day.  Aim for no more than 5 hours between eating.  If you eat breakfast, please do so within one hour of getting up.   Each meal should contain half fruits/vegetables, one quarter protein, and one quarter carbs (no bigger than a computer mouse)  Cut down on sweet beverages. This includes juice, soda, and sweet tea.   Drink at least 1 glass of water with each meal and aim for at least 8 glasses per day  Exercise at least 150 minutes every week.

## 2024-05-01 ENCOUNTER — Other Ambulatory Visit: Payer: Self-pay | Admitting: Physician Assistant

## 2024-07-12 ENCOUNTER — Other Ambulatory Visit: Payer: Self-pay

## 2024-07-12 ENCOUNTER — Other Ambulatory Visit (INDEPENDENT_AMBULATORY_CARE_PROVIDER_SITE_OTHER): Payer: Self-pay

## 2024-07-12 ENCOUNTER — Ambulatory Visit (INDEPENDENT_AMBULATORY_CARE_PROVIDER_SITE_OTHER): Admitting: Orthopedic Surgery

## 2024-07-12 DIAGNOSIS — M25512 Pain in left shoulder: Secondary | ICD-10-CM | POA: Diagnosis not present

## 2024-07-12 DIAGNOSIS — M5412 Radiculopathy, cervical region: Secondary | ICD-10-CM | POA: Diagnosis not present

## 2024-07-12 DIAGNOSIS — M19011 Primary osteoarthritis, right shoulder: Secondary | ICD-10-CM

## 2024-07-12 DIAGNOSIS — G8929 Other chronic pain: Secondary | ICD-10-CM | POA: Diagnosis not present

## 2024-07-12 DIAGNOSIS — M25511 Pain in right shoulder: Secondary | ICD-10-CM

## 2024-07-13 ENCOUNTER — Encounter: Payer: Self-pay | Admitting: Orthopedic Surgery

## 2024-07-13 NOTE — Progress Notes (Signed)
 Office Visit Note   Patient: Terry Mcpherson           Date of Birth: 03-28-1961           MRN: [id number] Visit Date: 07/12/2024 Requested by: Katrinka Garnette KIDD, MD [address] [place],  [place] [address] PCP: Katrinka Garnette KIDD, MD  Subjective: Chief Complaint  Patient presents with   Other    Bilateral shoulder pain R>L with radicular symptoms    HPI: Terry Mcpherson is a 63 y.o. male who presents to the office reporting bilateral shoulder pain right worse than left.  He is right-hand dominant.  Pain comes and goes.  Does have fairly severe pain when reaching behind.  He is not sleeping well and this does wake him from sleep at night.  Pain radiates sometimes to the elbow.  Also describes scapular pain as well as neck pain and numbness and tingling which does extend down to the dorsal hand.  He has tried physical therapy 2 to 3 years ago in the shoulder with marginal relief.  He states that when he sleeps sometimes putting the hand overhead helps his symptoms.  Massage therapy has helped some.  He has a history of glenohumeral joint injections the last one over a year ago which stopped helping his much as he would like..                ROS: All systems reviewed are negative as they relate to the chief complaint within the history of present illness.  Patient denies fevers or chills.  Assessment & Plan: Visit Diagnoses:  1. Chronic pain of both shoulders   2. Radiculopathy, cervical region     Plan: Impression is significant right shoulder arthritis with surprisingly well-maintained range of motion and strength.  He may also have a radicular component to his symptoms based on the scapular pain and radicular symptoms in the arm.  Plan is ultrasound-guided glenohumeral joint injection performed today.  Avoid overhead shots and pickleball which is a pain generator for him.  MRI C-spine indicated for right-sided radiculopathy.  Follow-up after those studies.  He may be heading for  shoulder replacement at sometime in the future.  Follow-Up Instructions: No follow-ups on file.   Orders:  Orders Placed This Encounter  Procedures   XR Cervical Spine 2 or 3 views   XR Shoulder Right   US  Guided Needle Placement - No Linked Charges   MR Cervical Spine w/o contrast   No orders of the defined types were placed in this encounter.     Procedures: Large Joint Inj: R glenohumeral on 07/12/2024 10:38 AM Indications: diagnostic evaluation and pain Details: 22 G 3.5 in needle, ultrasound-guided posterior approach  Arthrogram: No  Medications: 9 mL bupivacaine  0.5 %; 5 mL lidocaine  1 %; 40 mg triamcinolone  acetonide 40 MG/ML Outcome: tolerated well, no immediate complications Procedure, treatment alternatives, risks and benefits explained, specific risks discussed. Consent was given by the patient. Immediately prior to procedure a time out was called to verify the correct patient, procedure, equipment, support staff and site/side marked as required. Patient was prepped and draped in the usual sterile fashion.       Clinical Data: No additional findings.  Objective: Vital Signs: There were no vitals taken for this visit.  Physical Exam:  Constitutional: Patient appears well-developed HEENT:  Head: Normocephalic Eyes:EOM are normal Neck: Normal range of motion Cardiovascular: Normal rate Pulmonary/chest: Effort normal Neurologic: Patient is alert Skin: Skin is warm  Psychiatric: Patient has normal mood and affect  Ortho Exam: Ortho exam demonstrates range of motion of the right of 45/85/155.  Range of motion of the left 60/95/170.  Rotator cuff strength is intact at 5 out of 5 bilateral infraspinatus supraspinatus and subscap muscle testing.  Motor or sensory function to the hands intact.  Cervical spine range of motion flexion chin to chest extension 50 degrees rotation is about 60 degrees bilaterally.  Not too much in terms of coarse grinding or crepitus in the  right shoulder.  No AC joint tenderness to direct palpation.  Specialty Comments:  No specialty comments available.  Imaging: No results found.   PMFS History: Patient Active Problem List   Diagnosis Date Noted   Major depressive disorder with single episode, in full remission (HCC) 08/05/2022   Aortic atherosclerosis (HCC) 08/21/2021   BPH associated with nocturia 11/05/2017   Hearing loss of right ear 05/05/2017   Hyperglycemia 11/04/2016   Hyperlipidemia 11/04/2015   Erectile dysfunction 12/24/2014   History of alcohol abuse 12/24/2014   Allergic rhinitis 10/22/2010   Depression 10/07/2007   GERD 10/07/2007   Past Medical History:  Diagnosis Date   Allergy    Anxiety    Chronic sinusitis    Depression    GERD (gastroesophageal reflux disease)     Family History  Problem Relation Age of Onset   Depression Mother        sister, brother   Deep vein thrombosis Father        PE after fracture   Depression Father    Hyperlipidemia Father    Hypertension Father    Colon cancer Neg Hx    Esophageal cancer Neg Hx    Stomach cancer Neg Hx     Past Surgical History:  Procedure Laterality Date   COLONOSCOPY     egd with dilatation  2001   Social History   Occupational History   Occupation: Retired  Tobacco Use   Smoking status: Never   Smokeless tobacco: Never  Vaping Use   Vaping status: Never Used  Substance and Sexual Activity   Alcohol use: No    Alcohol/week: 0.0 standard drinks of alcohol   Drug use: No   Sexual activity: Not on file

## 2024-07-15 MED ORDER — TRIAMCINOLONE ACETONIDE 40 MG/ML IJ SUSP
40.0000 mg | INTRAMUSCULAR | Status: AC | PRN
  Administered 2024-07-12: 40 mg via INTRA_ARTICULAR

## 2024-07-15 MED ORDER — BUPIVACAINE HCL 0.5 % IJ SOLN
9.0000 mL | INTRAMUSCULAR | Status: AC | PRN
  Administered 2024-07-12: 9 mL via INTRA_ARTICULAR

## 2024-07-15 MED ORDER — LIDOCAINE HCL 1 % IJ SOLN
5.0000 mL | INTRAMUSCULAR | Status: AC | PRN
  Administered 2024-07-12: 5 mL

## 2024-07-18 ENCOUNTER — Telehealth: Payer: Self-pay | Admitting: Orthopedic Surgery

## 2024-07-18 NOTE — Telephone Encounter (Signed)
 Pt called starting spoke with his insurance [employer] and they states they did not received MRI Referral for right shoulder. Only received cervical region and states its two orders for the same body part. Please resend referral to [facility] ED MRI of right shoulder and cervical region. Please call pt about this matter at [phone].

## 2024-07-19 ENCOUNTER — Other Ambulatory Visit: Payer: Self-pay

## 2024-07-19 DIAGNOSIS — M19011 Primary osteoarthritis, right shoulder: Secondary | ICD-10-CM

## 2024-07-20 ENCOUNTER — Ambulatory Visit (HOSPITAL_COMMUNITY)

## 2024-07-20 NOTE — Telephone Encounter (Signed)
 I will get auth on this

## 2024-07-25 ENCOUNTER — Telehealth: Payer: Self-pay | Admitting: Orthopedic Surgery

## 2024-07-25 NOTE — Telephone Encounter (Signed)
 Pt called stating that he made an appt for right shoulder MRI. Pt states he has two different body part. Pt states cervicale was approved but [employer] need approval for right shoulder. Please expedite ASAP to [facility] for right shoulder. Pt is asking for a call back or a mychart message to let him know if has been done. He states [employer] will cancel appt for right shoulder MRI if they do not receive approval. Pt phone number is [phone].

## 2024-08-08 ENCOUNTER — Ambulatory Visit
Admission: RE | Admit: 2024-08-08 | Discharge: 2024-08-08 | Disposition: A | Discharge status: Home / Self Care | Source: Ambulatory Visit | Attending: Orthopedic Surgery | Admitting: Orthopedic Surgery

## 2024-08-08 DIAGNOSIS — M5020 Other cervical disc displacement, unspecified cervical region: Secondary | ICD-10-CM | POA: Diagnosis not present

## 2024-08-08 DIAGNOSIS — M5412 Radiculopathy, cervical region: Secondary | ICD-10-CM

## 2024-08-08 DIAGNOSIS — M19011 Primary osteoarthritis, right shoulder: Secondary | ICD-10-CM

## 2024-08-10 ENCOUNTER — Ambulatory Visit (INDEPENDENT_AMBULATORY_CARE_PROVIDER_SITE_OTHER): Payer: PRIVATE HEALTH INSURANCE | Admitting: Family Medicine

## 2024-08-10 ENCOUNTER — Ambulatory Visit: Payer: Self-pay | Admitting: Family Medicine

## 2024-08-10 ENCOUNTER — Encounter: Payer: Self-pay | Admitting: Family Medicine

## 2024-08-10 VITALS — BP 112/70 | HR 84 | Temp 97.2°F | Ht 75.0 in | Wt 211.8 lb

## 2024-08-10 DIAGNOSIS — Z1211 Encounter for screening for malignant neoplasm of colon: Secondary | ICD-10-CM

## 2024-08-10 DIAGNOSIS — E785 Hyperlipidemia, unspecified: Secondary | ICD-10-CM

## 2024-08-10 DIAGNOSIS — Z131 Encounter for screening for diabetes mellitus: Secondary | ICD-10-CM | POA: Diagnosis not present

## 2024-08-10 DIAGNOSIS — Z0000 Encounter for general adult medical examination without abnormal findings: Secondary | ICD-10-CM

## 2024-08-10 DIAGNOSIS — R739 Hyperglycemia, unspecified: Secondary | ICD-10-CM

## 2024-08-10 DIAGNOSIS — Z125 Encounter for screening for malignant neoplasm of prostate: Secondary | ICD-10-CM

## 2024-08-10 DIAGNOSIS — Z79899 Other long term (current) drug therapy: Secondary | ICD-10-CM | POA: Diagnosis not present

## 2024-08-10 LAB — CBC WITH DIFFERENTIAL/PLATELET
Basophils Absolute: 0 K/uL (ref 0.0–0.1)
Basophils Relative: 0.7 % (ref 0.0–3.0)
Eosinophils Absolute: 0.2 K/uL (ref 0.0–0.7)
Eosinophils Relative: 3 % (ref 0.0–5.0)
HCT: 46.3 % (ref 39.0–52.0)
Hemoglobin: 15.6 g/dL (ref 13.0–17.0)
Lymphocytes Relative: 26.7 % (ref 12.0–46.0)
Lymphs Abs: 1.7 K/uL (ref 0.7–4.0)
MCHC: 33.6 g/dL (ref 30.0–36.0)
MCV: 92.4 fl (ref 78.0–100.0)
Monocytes Absolute: 0.6 K/uL (ref 0.1–1.0)
Monocytes Relative: 9.1 % (ref 3.0–12.0)
Neutro Abs: 3.9 K/uL (ref 1.4–7.7)
Neutrophils Relative %: 60.5 % (ref 43.0–77.0)
Platelets: 296 K/uL (ref 150.0–400.0)
RBC: 5.01 Mil/uL (ref 4.22–5.81)
RDW: 12.6 % (ref 11.5–15.5)
WBC: 6.5 K/uL (ref 4.0–10.5)

## 2024-08-10 LAB — COMPREHENSIVE METABOLIC PANEL WITH GFR
ALT: 19 U/L (ref 0–53)
AST: 20 U/L (ref 0–37)
Albumin: 4.3 g/dL (ref 3.5–5.2)
Alkaline Phosphatase: 59 U/L (ref 39–117)
BUN: 13 mg/dL (ref 6–23)
CO2: 28 meq/L (ref 19–32)
Calcium: 9.2 mg/dL (ref 8.4–10.5)
Chloride: 106 meq/L (ref 96–112)
Creatinine, Ser: 0.84 mg/dL (ref 0.40–1.50)
GFR: 93.01 mL/min (ref >60.00)
Glucose, Bld: 100 mg/dL — ABNORMAL HIGH (ref 70–99)
Potassium: 4.1 meq/L (ref 3.5–5.1)
Sodium: 139 meq/L (ref 135–145)
Total Bilirubin: 0.5 mg/dL (ref 0.2–1.2)
Total Protein: 6.8 g/dL (ref 6.0–8.3)

## 2024-08-10 LAB — LIPID PANEL
Cholesterol: 162 mg/dL (ref 0–200)
HDL: 40.7 mg/dL (ref >39.00)
LDL Cholesterol: 101 mg/dL — ABNORMAL HIGH (ref 0–99)
NonHDL: 121.29
Total CHOL/HDL Ratio: 4
Triglycerides: 100 mg/dL (ref 0.0–149.0)
VLDL: 20 mg/dL (ref 0.0–40.0)

## 2024-08-10 LAB — VITAMIN B12: Vitamin B-12: 363 pg/mL (ref 211–911)

## 2024-08-10 LAB — HEMOGLOBIN A1C: Hgb A1c MFr Bld: 6 % (ref 4.6–6.5)

## 2024-08-10 LAB — PSA: PSA: 1.1 ng/mL (ref 0.10–4.00)

## 2024-08-10 NOTE — Progress Notes (Signed)
 Phone: [phone]   Subjective:  Patient presents today for their annual physical. Chief complaint-noted.   See problem oriented charting- ROS- full  review of systems was completed and negative  except for topics noted under acute/chronic concerns  The following were reviewed and entered/updated in epic: Past Medical History:  Diagnosis Date   Allergy    Anxiety    Chronic sinusitis    Depression    GERD (gastroesophageal reflux disease)    Patient Active Problem List   Diagnosis Date Noted   Aortic atherosclerosis (HCC) 08/21/2021    Priority: Medium    BPH associated with nocturia 11/05/2017    Priority: Medium    Hearing loss of right ear 05/05/2017    Priority: Medium    Hyperglycemia 11/04/2016    Priority: Medium    Hyperlipidemia 11/04/2015    Priority: Medium    History of alcohol abuse 12/24/2014    Priority: Medium    Depression 10/07/2007    Priority: Medium    Erectile dysfunction 12/24/2014    Priority: Low   Allergic rhinitis 10/22/2010    Priority: Low   GERD 10/07/2007    Priority: Low   Major depressive disorder with single episode, in full remission (HCC) 08/05/2022   Past Surgical History:  Procedure Laterality Date   COLONOSCOPY     egd with dilatation  2001    Family History  Problem Relation Age of Onset   Depression Mother        sister, brother   Deep vein thrombosis Father        PE after fracture   Depression Father    Hyperlipidemia Father    Hypertension Father    Colon cancer Neg Hx    Esophageal cancer Neg Hx    Stomach cancer Neg Hx     Medications- reviewed and updated Current Outpatient Medications  Medication Sig Dispense Refill   omeprazole (PRILOSEC) 20 MG capsule Take 20 mg by mouth daily.     rosuvastatin  (CRESTOR ) 5 MG tablet TAKE 1 TABLET (5 MG TOTAL) BY MOUTH DAILY. 90 tablet 3   sertraline  (ZOLOFT ) 100 MG tablet TAKE 1 AND 1/2 TABLETS DAILY BY MOUTH 135 tablet 3   sildenafil  (REVATIO ) 20 MG tablet TAKE  ONE TABLET BY MOUTH THREE TIMES DAILY 90 tablet 0   No current facility-administered medications for this visit.    Allergies-reviewed and updated Allergies  Allergen Reactions   Penicillins     hives    Social History   Social History Narrative   Married. 3 children, 2 step kids. No grandkids.       Semi retired- cut back more substantially in 2024. Doing more acting in 2024.    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 Oropharynx normal Neck: no thyromegaly CV: RRR no murmurs rubs or gallops Lungs: CTAB no crackles, wheeze, rhonchi Abdomen: soft/nontender/nondistended/normal bowel sounds. No rebound or guarding.  Ext: no edema Skin: warm, dry Neuro: grossly normal, moves all extremities, PERRLA Declines rectal  or genitourinary concerns/exam    Assessment and Plan  63 y.o. male presenting for annual physical.  Health Maintenance counseling: 1. Anticipatory guidance: Patient counseled regarding regular dental exams -q6 months, eye exams -yearly,  avoiding smoking and second hand smoke , limiting alcohol to 2 beverages per day - alcohol free still long term, no illicit drugs.   2. Risk factor reduction:  Advised patient of need for regular exercise and diet rich and fruits and vegetables to reduce risk of heart attack and stroke.  Exercise- weight circuits 3 days a week at [employer] and dog walks twice a day. Cautious with pickleball until right now.  Diet/weight management-down 6 lbs from last year- still being mindful- his goal was 200-210- 207 or 208 at home so at his goal  .  Wt Readings from Last 3 Encounters:  08/10/24 211 lb 12.8 oz (96.1 kg)  04/20/24 213 lb 12.8 oz (97 kg)  03/13/24 213 lb 12.8 oz (97 kg)  3. Immunizations/screenings/ancillary studies- plans on October at [facility], will do COVID shot then as well, Prevnar 20 consider at 22-  Immunization History  Administered Date(s) Administered   Hepatitis A, Adult 05/13/2021   Influenza Inj Mdck Quad Pf 07/24/2019   Influenza Whole 10/07/2007   Influenza, Mdck, Trivalent,PF 6+ MOS(egg free) 07/23/2023   Influenza,inj,Quad PF,6+ Mos 11/04/2016, 08/08/2019, 09/05/2020, 08/04/2021, 08/05/2022   Influenza-Unspecified 07/20/2017, 08/23/2018   PFIZER Comirnaty(Gray Top)Covid-19 Tri-Sucrose Vaccine 03/26/2021   PFIZER(Purple Top)SARS-COV-2 Vaccination 02/04/2020, 02/28/2020, 10/16/2020   Pfizer Covid-19 Vaccine Bivalent Booster 40yrs & up 09/25/2021   Pfizer(Comirnaty)Fall Seasonal Vaccine 12 years and older 07/23/2023, 07/29/2023   Td 06/30/2010, 05/09/2021   Typhoid Inactivated 05/13/2021   Zoster Recombinant(Shingrix ) 12/08/2018, 03/22/2019  4. Prostate cancer screening- low risk prior trend- update psa today   Lab Results  Component Value Date   PSA 1.19 08/09/2023   PSA 1.29 08/05/2022   PSA 1.01 08/04/2021   5. Colon cancer screening - 09/25/2014- go ahead and refer back with date 09/25/2024 .  6. Skin cancer screening- [facility] Dr Bard Molt as needed. advised regular sunscreen use. Denies worrisome, changing, or new skin lesions.  7. Smoking associated screening (lung cancer screening, AAA screen 65-75, UA)- never smoker 8. STD screening - only active with wife  Status of chronic or acute concerns   #hyperlipidemia- ct cardiac scoring 37.9 in 08/21/21 53% for age- started statin #Aortic atherosclerosis S: Medication:Rosuvastatin  5 mg daily- no side effects Lab Results  Component Value Date   CHOL 156 08/09/2023   HDL 37.10 (L) 08/09/2023   LDLCALC 87 08/09/2023   LDLDIRECT 149.0  12/08/2018   TRIG 163.0 (H) 08/09/2023   CHOLHDL 4 08/09/2023  A/P:  last LDL close to ideal range- continue to work on healthy eating and regular exercise and recheck today- likely tolerate as long as LDL under 100- has had weight loss so could be better  # Depression S: Medication: sertraline  150 mg     08/10/2024    9:24 AM 04/20/2024    1:46 PM 02/18/2024    8:55 AM  Depression screen PHQ 2/9  Decreased Interest 0 0 0  Down, Depressed, Hopeless 0 0 0  PHQ - 2 Score 0 0 0  Altered sleeping 0  0  Tired, decreased energy 0  0  Change in appetite 0  0  Feeling bad or failure about yourself  0  0  Trouble concentrating 0  0  Moving slowly or fidgety/restless 0  0  Suicidal thoughts  0  0  PHQ-9 Score 0  0  Difficult doing work/chores Not difficult at all  Not difficult at all  A/P: full remission- continue current medications      # Hyperglycemia/insulin resistance/prediabetes-but thankfully A1c not elevated S:  Medication: none Lab Results  Component Value Date   HGBA1C 5.8 08/09/2023   HGBA1C 5.7 08/05/2022   HGBA1C 5.6 08/04/2021   A/P:  a1c trending up last year-hopeful for reversal with weight loss   #Erectile dysfunction-sildenafil  helpful . Some peyronie's but no pain  #GERD-Prilosec 20 mg  daily helpful-Pepcid trial did not help in the past- did need short term twice daily but went back  #Relative B12 deficiency-recommended B12 last year-patient reports takes b complex- update today   #Hearing loss of right ear-referred back to ENT last year-patient reports- not in range for treatment   # Shoulder pain working with Dr. Lemuel some radicular symptoms-ultrasound-guided glenohumeral joint injection 07/12/2024 and patient reports - MRI C-spine with some facet joint irritation  Recommended follow up: Return in about 1 year (around 08/10/2025) for physical or sooner if needed.Schedule b4 you leave. Future Appointments  Date Time Provider Department Center  08/30/2024   1:45 PM Addie Cordella Hamilton, MD [facility] None   Lab/Order associations: fasting   ICD-10-CM   1. Preventative health care  Z00.00     2. Hyperlipidemia, unspecified hyperlipidemia type  E78.5     3. Hyperglycemia  R73.9     4. Screening for diabetes mellitus  Z13.1     5. Screening for prostate cancer  Z12.5     6. High risk medication use  Z79.899     7. Screen for colon cancer  Z12.11       No orders of the defined types were placed in this encounter.   Return precautions advised.  Garnette Lukes, MD

## 2024-08-10 NOTE — Patient Instructions (Addendum)
 Please stop by lab before you go If you have mychart- we will send your results within 3 business days of us  receiving them.  If you do not have mychart- we will call you about results within 5 business days of us  receiving them.  *please also note that you will see labs on mychart as soon as they post. I will later go in and write notes on them- will say notes from Dr. Katrinka Finn [facility] contact Please call to schedule visit and/or procedure IF you do not hear within a week Address: [address] [place], [place], [place] [address] Phone: [phone]   Let us  know when you get flu and COVID shot   Recommended follow up: Return in about 1 year (around 08/10/2025) for physical or sooner if needed.Schedule b4 you leave.

## 2024-08-30 ENCOUNTER — Encounter: Payer: Self-pay | Admitting: Orthopedic Surgery

## 2024-08-30 ENCOUNTER — Ambulatory Visit (INDEPENDENT_AMBULATORY_CARE_PROVIDER_SITE_OTHER): Admitting: Orthopedic Surgery

## 2024-08-30 DIAGNOSIS — M5412 Radiculopathy, cervical region: Secondary | ICD-10-CM | POA: Diagnosis not present

## 2024-08-30 NOTE — Progress Notes (Signed)
 Office Visit Note   Patient: Terry Mcpherson           Date of Birth: September 14, 1961           MRN: [id number] Visit Date: 08/30/2024 Requested by: Katrinka Garnette KIDD, MD [address] [place],  [place] [address] PCP: Katrinka Garnette KIDD, MD  Subjective: Chief Complaint  Patient presents with   Other    Follow up to review cervical and shoulder MRI scans    HPI: Terry Mcpherson is a 63 y.o. male who presents to the office reporting neck and right shoulder pain.  Since he was last seen has had MRI of the cervical spine as well as MRI of the right shoulder.  Both those are reviewed with the patient today.  States his neck is hurting.  Does hurt and with range of motion.  Was going down radiating into the hand with some numbness and tingling but that has improved.  Does report a lot of grinding and crepitus in the neck with range of motion.  His right shoulder pain is significant but he states he is used to it.  Symptoms in the neck ongoing for 4 months.  Symptoms in the shoulder several years.  Previous glenohumeral joint injection gave marginal relief.  MRI cervical spine is reviewed.  It does show reactive marrow edema around the right C3-4 facet due to active facet arthritis as well as moderate right sided C7 foraminal stenosis.  MRI of the shoulder is reviewed.  There is severe glenohumeral joint arthritis with some glenoid wear and intact rotator cuff with some mild tendinopathy of the supraspinatus and infraspinatus tendons..                ROS: All systems reviewed are negative as they relate to the chief complaint within the history of present illness.  Patient denies fevers or chills.  Assessment & Plan: Visit Diagnoses:  1. Radiculopathy, cervical region     Plan: Impression is cervical spine pain with less of a radicular component today.  In general the neck is bothering him more than the chronic shoulder pain.  This does not look surgical in the neck but I think he could respond  to 1 or 2 cervical spine epidural steroid injections.  We will send him to Dr. Eldonna for further evaluation and management.  Regarding the right shoulder I think he is heading for shoulder replacement at sometime in the future but as with his exam prior clinic visit he has very functional range of motion and strength.  Decision on shoulder replacement would be reverse replacement versus anatomic replacement.  Based on deformity I would lean more towards the reverse replacement.  Follow-Up Instructions: No follow-ups on file.   Orders:  Orders Placed This Encounter  Procedures   Ambulatory referral to Physical Medicine Rehab   No orders of the defined types were placed in this encounter.     Procedures: No procedures performed   Clinical Data: No additional findings.  Objective: Vital Signs: There were no vitals taken for this visit.  Physical Exam:  Constitutional: Patient appears well-developed HEENT:  Head: Normocephalic Eyes:EOM are normal Neck: Normal range of motion Cardiovascular: Normal rate Pulmonary/chest: Effort normal Neurologic: Patient is alert Skin: Skin is warm Psychiatric: Patient has normal mood and affect  Ortho Exam: Ortho exam demonstrates pretty good cervical spine range of motion flexion almost chin to chest extension 30 degrees with rotation about 60 degrees bilaterally.  Has good rotator  cuff strength bilaterally to infraspinatus supraspinatus and subscap muscle testing. Ortho exam demonstrates range of motion of the right of 45/85/155. Range of motion of the left 60/95/170. Rotator cuff strength is intact at 5 out of 5 bilateral infraspinatus supraspinatus and subscap muscle testing. Motor or sensory function to the hands intact.   Specialty Comments:  MRI CERVICAL SPINE WITHOUT CONTRAST   TECHNIQUE: Multiplanar, multisequence MR imaging of the cervical spine was performed. No intravenous contrast was administered.   COMPARISON:  Prior radiograph  from 07/12/2024.   FINDINGS: Alignment: Mild straightening of the normal cervical lordosis. No listhesis.   Vertebrae: Vertebral body height maintained without acute or chronic fracture. Bone marrow signal intensity within normal limits. No worrisome osseous lesions. Exuberant reactive marrow edema present about the right C3-4 facet due to active facet arthritis (series 104, image 2).   Cord: Normal signal and morphology.   Posterior Fossa, vertebral arteries, paraspinal tissues: Unremarkable.   Disc levels:   C2-C3: Normal interspace. Mild left-sided facet spurring. No canal or foraminal stenosis.   C3-C4: Small left paracentral disc protrusion indents the left ventral thecal sac, contacting and mildly flattening the left ventral cord (series 106, image 20). No cord signal changes. Mild bilateral facet arthrosis with prominent reactive marrow edema and small joint effusion on the right. No significant spinal stenosis. Mild left C4 foraminal narrowing. Right neural foramen remains patent.   C4-C5: Mild uncovertebral spurring without significant disc bulge. No spinal stenosis. Foramina remain patent.   C5-C6: Mild disc bulge with uncovertebral spurring. Mild right greater than left facet hypertrophy. No significant spinal stenosis. Mild right C6 foraminal narrowing. Left neural foramina remains patent.   C6-C7: Degenerative intervertebral disc space narrowing with circumferential disc osteophyte complex. Flattening of the ventral thecal sac without significant spinal stenosis. Moderate right C7 foraminal narrowing. Left neural foramen remains patent.   C7-T1: Normal interspace. Moderate left facet arthrosis. No spinal stenosis. Foramina remain patent.   IMPRESSION: 1. 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 Finding could serve as a source for neck pain and referred symptoms. 2. Moderate right C7 foraminal stenosis related to  disc osteophyte and facet disease. 3. Small left paracentral disc protrusion at C3-4, contacting and mildly flattening the left ventral cord. No cord signal changes or significant spinal stenosis. 4. Mild right C6 foraminal narrowing related to disc bulge and uncovertebral disease.     Electronically Signed   By: Morene Hoard M.D.   On: 08/09/2024 12:29  Imaging: No results found.   PMFS History: Patient Active Problem List   Diagnosis Date Noted   Major depressive disorder with single episode, in full remission 08/05/2022   Aortic atherosclerosis 08/21/2021   BPH associated with nocturia 11/05/2017   Hearing loss of right ear 05/05/2017   Hyperglycemia 11/04/2016   Hyperlipidemia 11/04/2015   Erectile dysfunction 12/24/2014   History of alcohol abuse 12/24/2014   Allergic rhinitis 10/22/2010   Depression 10/07/2007   GERD 10/07/2007   Past Medical History:  Diagnosis Date   Allergy    Anxiety    Chronic sinusitis    Depression    GERD (gastroesophageal reflux disease)     Family History  Problem Relation Age of Onset   Depression Mother        sister, brother   Deep vein thrombosis Father        PE after fracture   Depression Father    Hyperlipidemia Father    Hypertension Father  Colon cancer Neg Hx    Esophageal cancer Neg Hx    Stomach cancer Neg Hx     Past Surgical History:  Procedure Laterality Date   COLONOSCOPY     egd with dilatation  2001   Social History   Occupational History   Occupation: Retired  Tobacco Use   Smoking status: Never   Smokeless tobacco: Never  Vaping Use   Vaping status: Never Used  Substance and Sexual Activity   Alcohol use: No    Alcohol/week: 0.0 standard drinks of alcohol   Drug use: No   Sexual activity: Not on file

## 2024-09-01 ENCOUNTER — Telehealth: Payer: Self-pay | Admitting: Physical Medicine and Rehabilitation

## 2024-09-01 NOTE — Telephone Encounter (Signed)
 Pt called wanting to make an appt for neck injection. Pt call back number is [phone]

## 2024-09-14 ENCOUNTER — Other Ambulatory Visit: Payer: Self-pay

## 2024-09-14 ENCOUNTER — Ambulatory Visit: Admitting: Physical Medicine and Rehabilitation

## 2024-09-14 VITALS — BP 144/91 | HR 61

## 2024-09-14 DIAGNOSIS — M5412 Radiculopathy, cervical region: Secondary | ICD-10-CM | POA: Diagnosis not present

## 2024-09-14 MED ORDER — METHYLPREDNISOLONE ACETATE 40 MG/ML IJ SUSP
40.0000 mg | Freq: Once | INTRAMUSCULAR | Status: AC
  Administered 2024-09-14: 40 mg

## 2024-09-14 NOTE — Progress Notes (Signed)
 Terry Mcpherson - 63 y.o. male MRN [id number]  Date of birth: 1961-06-22  Office Visit Note: Visit Date: 09/14/2024 PCP: Katrinka Garnette KIDD, MD Referred by: Katrinka Garnette KIDD, MD  Subjective: Chief Complaint  Patient presents with   Neck - Pain   HPI:  Terry Mcpherson is a 63 y.o. male who comes in today at the request of Dr. JUDITHANN Glendia Hutchinson for planned Right C7-T1 Cervical Interlaminar epidural steroid injection with fluoroscopic guidance.  The patient has failed conservative care including home exercise, medications, time and activity modification.  This injection will be diagnostic and hopefully therapeutic.  Please see requesting physician notes for further details and justification. Consider right C3-4 facet injection   ROS Otherwise per HPI.  Assessment & Plan: Visit Diagnoses:    ICD-10-CM   1. Cervical radiculopathy  M54.12 XR C-ARM NO REPORT    Epidural Steroid injection    methylPREDNISolone  acetate (DEPO-MEDROL ) injection 40 mg      Plan: No additional findings.   Meds & Orders:  Meds ordered this encounter  Medications   methylPREDNISolone  acetate (DEPO-MEDROL ) injection 40 mg    Orders Placed This Encounter  Procedures   XR C-ARM NO REPORT   Epidural Steroid injection    Follow-up: Return for visit to requesting provider as needed.   Procedures: No procedures performed  Cervical Epidural Steroid Injection - Interlaminar Approach with Fluoroscopic Guidance  Patient: Terry Mcpherson      Date of Birth: [birth year] MRN: [id number] PCP: Katrinka Garnette KIDD, MD      Visit Date: 09/14/2024   Universal Protocol:    Date/Time: 10/23/258:43 AM  Consent Given By: the patient  Position: PRONE  Additional Comments: Vital signs were monitored before and after the procedure. Patient was prepped and draped in the usual sterile fashion. The correct patient, procedure, and site was verified.   Injection Procedure Details:   Procedure diagnoses: Cervical  radiculopathy [M54.12]    Meds Administered:  Meds ordered this encounter  Medications   methylPREDNISolone  acetate (DEPO-MEDROL ) injection 40 mg     Laterality: Right  Location/Site: C7-T1  Needle: 3.5 in., 20 ga. Tuohy  Needle Placement: Paramedian epidural space  Findings:  -Comments: Excellent flow of contrast into the epidural space.  Procedure Details: Using a paramedian approach from the side mentioned above, the region overlying the inferior lamina was localized under fluoroscopic visualization and the soft tissues overlying this structure were infiltrated with 4 ml. of 1% Lidocaine  without Epinephrine. A # 20 gauge, Tuohy needle was inserted into the epidural space using a paramedian approach.  The epidural space was localized using loss of resistance along with contralateral oblique bi-planar fluoroscopic views.  After negative aspirate for air, blood, and CSF, a 2 ml. volume of Isovue-250 was injected into the epidural space and the flow of contrast was observed. Radiographs were obtained for documentation purposes.   The injectate was administered into the level noted above.  Additional Comments:  The patient tolerated the procedure well Dressing: 2 x 2 sterile gauze and Band-Aid    Post-procedure details: Patient was observed during the procedure. Post-procedure instructions were reviewed.  Patient left the clinic in stable condition.   Clinical History: MRI CERVICAL SPINE WITHOUT CONTRAST   TECHNIQUE: Multiplanar, multisequence MR imaging of the cervical spine was performed. No intravenous contrast was administered.   COMPARISON:  Prior radiograph from 07/12/2024.   FINDINGS: Alignment: Mild straightening of the normal cervical lordosis. No listhesis.   Vertebrae: Vertebral body height  maintained without acute or chronic fracture. Bone marrow signal intensity within normal limits. No worrisome osseous lesions. Exuberant reactive marrow edema  present about the right C3-4 facet due to active facet arthritis (series 104, image 2).   Cord: Normal signal and morphology.   Posterior Fossa, vertebral arteries, paraspinal tissues: Unremarkable.   Disc levels:   C2-C3: Normal interspace. Mild left-sided facet spurring. No canal or foraminal stenosis.   C3-C4: Small left paracentral disc protrusion indents the left ventral thecal sac, contacting and mildly flattening the left ventral cord (series 106, image 20). No cord signal changes. Mild bilateral facet arthrosis with prominent reactive marrow edema and small joint effusion on the right. No significant spinal stenosis. Mild left C4 foraminal narrowing. Right neural foramen remains patent.   C4-C5: Mild uncovertebral spurring without significant disc bulge. No spinal stenosis. Foramina remain patent.   C5-C6: Mild disc bulge with uncovertebral spurring. Mild right greater than left facet hypertrophy. No significant spinal stenosis. Mild right C6 foraminal narrowing. Left neural foramina remains patent.   C6-C7: Degenerative intervertebral disc space narrowing with circumferential disc osteophyte complex. Flattening of the ventral thecal sac without significant spinal stenosis. Moderate right C7 foraminal narrowing. Left neural foramen remains patent.   C7-T1: Normal interspace. Moderate left facet arthrosis. No spinal stenosis. Foramina remain patent.   IMPRESSION: 1. 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 Finding could serve as a source for neck pain and referred symptoms. 2. Moderate right C7 foraminal stenosis related to disc osteophyte and facet disease. 3. Small left paracentral disc protrusion at C3-4, contacting and mildly flattening the left ventral cord. No cord signal changes or significant spinal stenosis. 4. Mild right C6 foraminal narrowing related to disc bulge and uncovertebral disease.     Electronically  Signed   By: Morene Hoard M.D.   On: 08/09/2024 12:29     Objective:  VS:  HT:    WT:   BMI:     BP:(!) 144/91  HR:61bpm  TEMP: ( )  RESP:  Physical Exam Vitals and nursing note reviewed.  Constitutional:      General: He is not in acute distress.    Appearance: Normal appearance. He is not ill-appearing.  HENT:     Head: Normocephalic and atraumatic.     Right Ear: External ear normal.     Left Ear: External ear normal.  Eyes:     Extraocular Movements: Extraocular movements intact.  Cardiovascular:     Rate and Rhythm: Normal rate.     Pulses: Normal pulses.  Abdominal:     General: There is no distension.     Palpations: Abdomen is soft.  Musculoskeletal:        General: No signs of injury.     Cervical back: Neck supple. Tenderness present. No rigidity.     Right lower leg: No edema.     Left lower leg: No edema.     Comments: Patient has good strength in the upper extremities with 5 out of 5 strength in wrist extension long finger flexion APB.  No intrinsic hand muscle atrophy.  Negative Hoffmann's test.  Lymphadenopathy:     Cervical: No cervical adenopathy.  Skin:    Findings: No erythema or rash.  Neurological:     General: No focal deficit present.     Mental Status: He is alert and oriented to person, place, and time.     Sensory: No sensory deficit.  Motor: No weakness or abnormal muscle tone.     Coordination: Coordination normal.  Psychiatric:        Mood and Affect: Mood normal.        Behavior: Behavior normal.      Imaging: No results found.

## 2024-09-14 NOTE — Procedures (Signed)
 Cervical Epidural Steroid Injection - Interlaminar Approach with Fluoroscopic Guidance  Patient: Terry Mcpherson      Date of Birth: Apr 11, 1961 MRN: [id number] PCP: Katrinka Garnette KIDD, MD      Visit Date: 09/14/2024   Universal Protocol:    Date/Time: 10/23/258:43 AM  Consent Given By: the patient  Position: PRONE  Additional Comments: Vital signs were monitored before and after the procedure. Patient was prepped and draped in the usual sterile fashion. The correct patient, procedure, and site was verified.   Injection Procedure Details:   Procedure diagnoses: Cervical radiculopathy [M54.12]    Meds Administered:  Meds ordered this encounter  Medications   methylPREDNISolone  acetate (DEPO-MEDROL ) injection 40 mg     Laterality: Right  Location/Site: C7-T1  Needle: 3.5 in., 20 ga. Tuohy  Needle Placement: Paramedian epidural space  Findings:  -Comments: Excellent flow of contrast into the epidural space.  Procedure Details: Using a paramedian approach from the side mentioned above, the region overlying the inferior lamina was localized under fluoroscopic visualization and the soft tissues overlying this structure were infiltrated with 4 ml. of 1% Lidocaine  without Epinephrine. A # 20 gauge, Tuohy needle was inserted into the epidural space using a paramedian approach.  The epidural space was localized using loss of resistance along with contralateral oblique bi-planar fluoroscopic views.  After negative aspirate for air, blood, and CSF, a 2 ml. volume of Isovue-250 was injected into the epidural space and the flow of contrast was observed. Radiographs were obtained for documentation purposes.   The injectate was administered into the level noted above.  Additional Comments:  The patient tolerated the procedure well Dressing: 2 x 2 sterile gauze and Band-Aid    Post-procedure details: Patient was observed during the procedure. Post-procedure instructions were  reviewed.  Patient left the clinic in stable condition.

## 2024-09-14 NOTE — Progress Notes (Signed)
 Pain Scale   Average Pain 4 Patient advising he has neck pain that is centered in neck. Patient advising his pain constant.        +Driver, -BT, -Dye Allergies.

## 2024-09-25 ENCOUNTER — Encounter: Payer: Self-pay | Admitting: Radiology

## 2024-09-27 ENCOUNTER — Ambulatory Visit: Payer: Self-pay | Admitting: Physical Medicine and Rehabilitation

## 2024-09-27 ENCOUNTER — Other Ambulatory Visit: Payer: Self-pay | Admitting: Physical Medicine and Rehabilitation

## 2024-09-27 DIAGNOSIS — M542 Cervicalgia: Secondary | ICD-10-CM

## 2024-09-27 DIAGNOSIS — M47812 Spondylosis without myelopathy or radiculopathy, cervical region: Secondary | ICD-10-CM

## 2024-10-02 ENCOUNTER — Other Ambulatory Visit: Payer: Self-pay | Admitting: Physician Assistant

## 2024-10-24 ENCOUNTER — Ambulatory Visit: Admitting: Physical Medicine and Rehabilitation

## 2024-10-24 ENCOUNTER — Other Ambulatory Visit: Payer: Self-pay

## 2024-10-24 VITALS — BP 131/83 | HR 69

## 2024-10-24 DIAGNOSIS — M47812 Spondylosis without myelopathy or radiculopathy, cervical region: Secondary | ICD-10-CM

## 2024-10-24 MED ORDER — METHYLPREDNISOLONE ACETATE 40 MG/ML IJ SUSP
40.0000 mg | Freq: Once | INTRAMUSCULAR | Status: AC
  Administered 2024-10-24: 40 mg

## 2024-10-24 NOTE — Progress Notes (Unsigned)
 Pain Scale   Average Pain 3  Facet injections, neck and rt shoulder pain.       +Driver, -BT, -Dye Allergies.

## 2024-10-25 NOTE — Procedures (Signed)
 Cervical Facet Joint Intra-Articular Injection with Fluoroscopic Guidance  Patient: Terry Mcpherson      Date of Birth: [birth year] MRN: [id number] PCP: Katrinka Garnette KIDD, MD      Visit Date: 10/24/2024   Universal Protocol:    Date/Time: 12/03/258:55 AM  Consent Given By: the patient  Position: PRONE  Additional Comments: Vital signs were monitored before and after the procedure. Patient was prepped and draped in the usual sterile fashion. The correct patient, procedure, and site was verified.   Injection Procedure Details:  Procedure Site One Meds Administered:  Meds ordered this encounter  Medications   methylPREDNISolone  acetate (DEPO-MEDROL ) injection 40 mg     Laterality: Right  Location/Site:  C3-4  Needle size: 25 G  Needle type: Spinal  Needle Placement: Articular  Findings:  -Contrast Used: 0.5 mL iohexol 180 mg iodine/mL   -Comments: Excellent flow of contrast producing a partial arthrogram.  Procedure Details: The region overlying the facet joints mentioned above were localized under fluoroscopic visualization. The needle was inserted down to the level of the lateral mass of the superior articular process of the facet joint to be injected. Then, the needle was walked off inferiorly into the lateral aspect of the facet joint. Bi-planar images were used for confirming placement and spot radiographs were documented.  A 0.25 ml volume of Omnipaque-240 was injected into the facet joint and a standard partial arthrogram was obtained. Radiographs were obtained of the arthrogram. A 0.5 ml. volume of the steroid/anesthetic solution was injected into the joint. This procedure was repeated for each facet joint injected.   Additional Comments:  No complications occurred Dressing: Band-Aid    Post-procedure details: Patient was observed during the procedure. Post-procedure instructions were reviewed.  Patient left the clinic in stable condition.

## 2024-10-25 NOTE — Progress Notes (Signed)
 Terry Mcpherson - 63 y.o. male MRN [id number]  Date of birth: 1961/05/18  Office Visit Note: Visit Date: 10/24/2024 PCP: Katrinka Garnette KIDD, MD Referred by: Katrinka Garnette KIDD, MD  Subjective: Chief Complaint  Patient presents with   Neck - Pain   HPI:  Terry Mcpherson is a 63 y.o. male who comes in today at the request of Duwaine Pouch, FNP and Dr. JUDITHANN Glendia Dean for planned Right  C3-4 Cervical facet/medial branch block with fluoroscopic guidance.  The patient has failed conservative care including home exercise, medications, time and activity modification.  This injection will be diagnostic and hopefully therapeutic.  Please see requesting physician notes for further details and justification.  Exam has shown concordant pain with facet joint loading.   ROS Otherwise per HPI.  Assessment & Plan: Visit Diagnoses:    ICD-10-CM   1. Cervical spondylosis without myelopathy  M47.812 XR C-ARM NO REPORT    Facet Injection    methylPREDNISolone  acetate (DEPO-MEDROL ) injection 40 mg      Plan: No additional findings.   Meds & Orders:  Meds ordered this encounter  Medications   methylPREDNISolone  acetate (DEPO-MEDROL ) injection 40 mg    Orders Placed This Encounter  Procedures   Facet Injection   XR C-ARM NO REPORT    Follow-up: Return for visit to requesting provider as needed.   Procedures: No procedures performed  Cervical Facet Joint Intra-Articular Injection with Fluoroscopic Guidance  Patient: Terry Mcpherson      Date of Birth: 03-May-1961 MRN: [id number] PCP: Katrinka Garnette KIDD, MD      Visit Date: 10/24/2024   Universal Protocol:    Date/Time: 12/03/258:55 AM  Consent Given By: the patient  Position: PRONE  Additional Comments: Vital signs were monitored before and after the procedure. Patient was prepped and draped in the usual sterile fashion. The correct patient, procedure, and site was verified.   Injection Procedure Details:  Procedure Site One Meds  Administered:  Meds ordered this encounter  Medications   methylPREDNISolone  acetate (DEPO-MEDROL ) injection 40 mg     Laterality: Right  Location/Site:  C3-4  Needle size: 25 G  Needle type: Spinal  Needle Placement: Articular  Findings:  -Contrast Used: 0.5 mL iohexol 180 mg iodine/mL   -Comments: Excellent flow of contrast producing a partial arthrogram.  Procedure Details: The region overlying the facet joints mentioned above were localized under fluoroscopic visualization. The needle was inserted down to the level of the lateral mass of the superior articular process of the facet joint to be injected. Then, the needle was walked off inferiorly into the lateral aspect of the facet joint. Bi-planar images were used for confirming placement and spot radiographs were documented.  A 0.25 ml volume of Omnipaque-240 was injected into the facet joint and a standard partial arthrogram was obtained. Radiographs were obtained of the arthrogram. A 0.5 ml. volume of the steroid/anesthetic solution was injected into the joint. This procedure was repeated for each facet joint injected.   Additional Comments:  No complications occurred Dressing: Band-Aid    Post-procedure details: Patient was observed during the procedure. Post-procedure instructions were reviewed.  Patient left the clinic in stable condition.   Clinical History: MRI CERVICAL SPINE WITHOUT CONTRAST   TECHNIQUE: Multiplanar, multisequence MR imaging of the cervical spine was performed. No intravenous contrast was administered.   COMPARISON:  Prior radiograph from 07/12/2024.   FINDINGS: Alignment: Mild straightening of the normal cervical lordosis. No listhesis.   Vertebrae: Vertebral body  height maintained without acute or chronic fracture. Bone marrow signal intensity within normal limits. No worrisome osseous lesions. Exuberant reactive marrow edema present about the right C3-4 facet due to active facet  arthritis (series 104, image 2).   Cord: Normal signal and morphology.   Posterior Fossa, vertebral arteries, paraspinal tissues: Unremarkable.   Disc levels:   C2-C3: Normal interspace. Mild left-sided facet spurring. No canal or foraminal stenosis.   C3-C4: Small left paracentral disc protrusion indents the left ventral thecal sac, contacting and mildly flattening the left ventral cord (series 106, image 20). No cord signal changes. Mild bilateral facet arthrosis with prominent reactive marrow edema and small joint effusion on the right. No significant spinal stenosis. Mild left C4 foraminal narrowing. Right neural foramen remains patent.   C4-C5: Mild uncovertebral spurring without significant disc bulge. No spinal stenosis. Foramina remain patent.   C5-C6: Mild disc bulge with uncovertebral spurring. Mild right greater than left facet hypertrophy. No significant spinal stenosis. Mild right C6 foraminal narrowing. Left neural foramina remains patent.   C6-C7: Degenerative intervertebral disc space narrowing with circumferential disc osteophyte complex. Flattening of the ventral thecal sac without significant spinal stenosis. Moderate right C7 foraminal narrowing. Left neural foramen remains patent.   C7-T1: Normal interspace. Moderate left facet arthrosis. No spinal stenosis. Foramina remain patent.   IMPRESSION: 1. 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 Finding could serve as a source for neck pain and referred symptoms. 2. Moderate right C7 foraminal stenosis related to disc osteophyte and facet disease. 3. Small left paracentral disc protrusion at C3-4, contacting and mildly flattening the left ventral cord. No cord signal changes or significant spinal stenosis. 4. Mild right C6 foraminal narrowing related to disc bulge and uncovertebral disease.     Electronically Signed   By: Morene Hoard M.D.   On:  08/09/2024 12:29     Objective:  VS:  HT:    WT:   BMI:     BP:131/83  HR:69bpm  TEMP: ( )  RESP:  Physical Exam Vitals and nursing note reviewed.  Constitutional:      General: He is not in acute distress.    Appearance: Normal appearance. He is not ill-appearing.  HENT:     Head: Normocephalic and atraumatic.     Right Ear: External ear normal.     Left Ear: External ear normal.  Eyes:     Extraocular Movements: Extraocular movements intact.  Cardiovascular:     Rate and Rhythm: Normal rate.     Pulses: Normal pulses.  Abdominal:     General: There is no distension.     Palpations: Abdomen is soft.  Musculoskeletal:        General: No signs of injury.     Cervical back: Neck supple. Tenderness present. No rigidity.     Right lower leg: No edema.     Left lower leg: No edema.     Comments: Patient has good strength in the upper extremities with 5 out of 5 strength in wrist extension long finger flexion APB.  No intrinsic hand muscle atrophy.  Negative Hoffmann's test.  Lymphadenopathy:     Cervical: No cervical adenopathy.  Skin:    Findings: No erythema or rash.  Neurological:     General: No focal deficit present.     Mental Status: He is alert and oriented to person, place, and time.     Sensory: No sensory deficit.  Motor: No weakness or abnormal muscle tone.     Coordination: Coordination normal.  Psychiatric:        Mood and Affect: Mood normal.        Behavior: Behavior normal.      Imaging: XR C-ARM NO REPORT Result Date: 10/24/2024 Please see Notes tab for imaging impression.

## 2024-10-26 ENCOUNTER — Other Ambulatory Visit: Payer: Self-pay | Admitting: Family Medicine

## 2024-12-27 ENCOUNTER — Other Ambulatory Visit: Payer: Self-pay | Admitting: Family Medicine

## 2025-08-13 ENCOUNTER — Encounter: Admitting: Family Medicine
